# Patient Record
Sex: Male | Born: 1960 | Race: White | Hispanic: No | Smoking: Never smoker
Health system: Southern US, Community
[De-identification: ages and names within clinical notes are randomized; demographics above are authoritative.]

## PROBLEM LIST (undated history)

## (undated) DIAGNOSIS — E119 Type 2 diabetes mellitus without complications: Secondary | ICD-10-CM

---

## 2007-07-01 ENCOUNTER — Emergency Department (HOSPITAL_COMMUNITY): Admission: EM | Admit: 2007-07-01 | Discharge: 2007-07-01 | Payer: Self-pay | Admitting: Emergency Medicine

## 2014-05-16 ENCOUNTER — Emergency Department (HOSPITAL_COMMUNITY): Payer: BC Managed Care – PPO

## 2014-05-16 ENCOUNTER — Encounter (HOSPITAL_COMMUNITY): Payer: Self-pay | Admitting: Emergency Medicine

## 2014-05-16 ENCOUNTER — Emergency Department (HOSPITAL_COMMUNITY)
Admission: EM | Admit: 2014-05-16 | Discharge: 2014-05-16 | Disposition: A | Discharge status: Home / Self Care | Payer: BC Managed Care – PPO | Attending: Emergency Medicine | Admitting: Emergency Medicine

## 2014-05-16 DIAGNOSIS — Z7982 Long term (current) use of aspirin: Secondary | ICD-10-CM | POA: Diagnosis not present

## 2014-05-16 DIAGNOSIS — R079 Chest pain, unspecified: Secondary | ICD-10-CM

## 2014-05-16 DIAGNOSIS — Z79899 Other long term (current) drug therapy: Secondary | ICD-10-CM | POA: Insufficient documentation

## 2014-05-16 DIAGNOSIS — R0789 Other chest pain: Secondary | ICD-10-CM

## 2014-05-16 DIAGNOSIS — I159 Secondary hypertension, unspecified: Secondary | ICD-10-CM

## 2014-05-16 LAB — CBC WITH DIFFERENTIAL/PLATELET
Basophils Absolute: 0.1 10*3/uL (ref 0.0–0.1)
Basophils Relative: 1 % (ref 0–1)
Eosinophils Absolute: 0 10*3/uL (ref 0.0–0.7)
Eosinophils Relative: 1 % (ref 0–5)
HCT: 45.2 % (ref 39.0–52.0)
Hemoglobin: 16.3 g/dL (ref 13.0–17.0)
Lymphocytes Relative: 30 % (ref 12–46)
Lymphs Abs: 1.4 10*3/uL (ref 0.7–4.0)
MCH: 31.9 pg (ref 26.0–34.0)
MCHC: 36.1 g/dL — ABNORMAL HIGH (ref 30.0–36.0)
MCV: 88.5 fL (ref 78.0–100.0)
Monocytes Absolute: 0.4 10*3/uL (ref 0.1–1.0)
Monocytes Relative: 8 % (ref 3–12)
Neutro Abs: 2.9 10*3/uL (ref 1.7–7.7)
Neutrophils Relative %: 60 % (ref 43–77)
Platelets: 184 10*3/uL (ref 150–400)
RBC: 5.11 MIL/uL (ref 4.22–5.81)
RDW: 12.4 % (ref 11.5–15.5)
WBC: 4.8 10*3/uL (ref 4.0–10.5)

## 2014-05-16 LAB — COMPREHENSIVE METABOLIC PANEL
ALT: <5 U/L (ref 0–53)
AST: <5 U/L (ref 0–37)
Albumin: 4.1 g/dL (ref 3.5–5.2)
Alkaline Phosphatase: 71 U/L (ref 39–117)
Anion gap: 12 (ref 5–15)
BUN: 14 mg/dL (ref 6–23)
CO2: 27 mEq/L (ref 19–32)
Calcium: 9.9 mg/dL (ref 8.4–10.5)
Chloride: 99 mEq/L (ref 96–112)
Creatinine, Ser: 0.83 mg/dL (ref 0.50–1.35)
GFR calc Af Amer: >90 mL/min (ref >90)
GFR calc non Af Amer: >90 mL/min (ref >90)
Glucose, Bld: 154 mg/dL — ABNORMAL HIGH (ref 70–99)
Potassium: 4.5 mEq/L (ref 3.7–5.3)
Sodium: 138 mEq/L (ref 137–147)
Total Bilirubin: 0.5 mg/dL (ref 0.3–1.2)
Total Protein: 8.3 g/dL (ref 6.0–8.3)

## 2014-05-16 LAB — TROPONIN I: Troponin I: <0.3 ng/mL (ref <0.30)

## 2014-05-16 LAB — I-STAT TROPONIN, ED: Troponin i, poc: 0 ng/mL (ref 0.00–0.08)

## 2014-05-16 MED ORDER — METOPROLOL TARTRATE 50 MG PO TABS
50.0000 mg | ORAL_TABLET | Freq: Once | ORAL | Status: AC
  Administered 2014-05-16: 50 mg via ORAL
  Filled 2014-05-16: qty 1

## 2014-05-16 MED ORDER — HYDROCHLOROTHIAZIDE 25 MG PO TABS: 25.0000 mg | ORAL_TABLET | Freq: Every day | ORAL | Status: AC

## 2014-05-16 MED ORDER — ASPIRIN 81 MG PO CHEW
324.0000 mg | CHEWABLE_TABLET | Freq: Once | ORAL | Status: AC
  Administered 2014-05-16: 324 mg via ORAL
  Filled 2014-05-16: qty 4

## 2014-05-16 MED ORDER — SODIUM CHLORIDE 0.9 % IV SOLN
1000.0000 mL | INTRAVENOUS | Status: DC
  Administered 2014-05-16: 1000 mL via INTRAVENOUS

## 2014-05-16 NOTE — ED Provider Notes (Signed)
CSN: [id number]     Arrival date & time 05/16/14  1100 History   This chart was scribed for Alexander Warner Alexander Schreier, MD by Murriel HopperAlec Warner, ED Scribe. This patient was seen in room APA19/APA19 and the patient's care was started at 11:21 AM.   Chief Complaint  Patient presents with  . Chest Pain    HPI HPI Comments: Alexander HammingMark Warner is a 53 y.o. male who presents to the Emergency Department complaining of intermittent chest palpitations that have been present since this morning. Pt reports that his heart is "thumping like crazy" and also reports associated lightheadedness whenever he bends down and stands back up. Pt also reports being somewhat SOB. He has no Hx of heart problems and no FHx of heart problems. Pt takes aspirin. Pt is not a smoker and drinks alcohol regularly. Pt reports drinking alcohol last night. Pt denies vomiting   History reviewed. No pertinent past medical history. History reviewed. No pertinent past surgical history. History reviewed. No pertinent family history. History  Substance Use Topics  . Smoking status: Never Smoker   . Smokeless tobacco: Not on file  . Alcohol Use: Yes     Comment: daily use    Review of Systems  A complete 10 system review of systems was obtained and all systems are negative except as noted in the HPI and PMH.    Allergies  Review of patient's allergies indicates no known allergies.  Home Medications   Prior to Admission medications   Medication Sig Start Date End Date Taking? Authorizing Provider  aspirin EC 81 MG tablet Take 81 mg by mouth daily.   Yes Historical Provider, MD  hydrochlorothiazide (HYDRODIURIL) 25 MG tablet Take 1 tablet (25 mg total) by mouth daily. 05/16/14   Alexander Warner Adisson Deak, MD   BP 167/109  Pulse 85  Temp(Src) 98.4 F (36.9 C) (Oral)  Resp 14  Ht 5\' 10"  (1.778 m)  Wt 195 lb (88.451 kg)  BMI 27.98 kg/m2  SpO2 99% Physical Exam  Nursing note and vitals reviewed. Constitutional: He appears well-developed and well-nourished. No  distress.  HENT:  Head: Normocephalic and atraumatic.  Right Ear: External ear normal.  Left Ear: External ear normal.  Eyes: Conjunctivae are normal. Right eye exhibits no discharge. Left eye exhibits no discharge. No scleral icterus.  Neck: Neck supple. No tracheal deviation present.  Cardiovascular: Normal rate, regular rhythm and intact distal pulses.   Pulmonary/Chest: Effort normal and breath sounds normal. No stridor. No respiratory distress. He has no wheezes. He has no rales.  Abdominal: Soft. Bowel sounds are normal. He exhibits no distension. There is no tenderness. There is no rebound and no guarding.  Musculoskeletal: He exhibits no edema and no tenderness.  Neurological: He is alert. He has normal strength. No cranial nerve deficit (no facial droop, extraocular movements intact, no slurred speech) or sensory deficit. He exhibits normal muscle tone. He displays no seizure activity. Coordination normal.  Skin: Skin is warm and dry. No rash noted.  Psychiatric: He has a normal mood and affect.    ED Course  Procedures (including critical care time)  DIAGNOSTIC STUDIES: Oxygen Saturation is 99% on RA, normal by my interpretation.    COORDINATION OF CARE: 11:26 AM Discussed treatment plan with pt at bedside and pt agreed to plan.   Labs Review Labs Reviewed  CBC WITH DIFFERENTIAL - Abnormal; Notable for the following:    MCHC 36.1 (*)    All other components within normal limits  COMPREHENSIVE METABOLIC PANEL -  Abnormal; Notable for the following:    Glucose, Bld 154 (*)    All other components within normal limits  TROPONIN I  Rosezena SensorI-STAT TROPOININ, ED    Imaging Review Dg Chest Port 1 View  05/16/2014   CLINICAL DATA:  Chest pain, palpitations  EXAM: PORTABLE CHEST - 1 VIEW  COMPARISON:  None.  FINDINGS: The heart size and mediastinal contours are within normal limits. Both lungs are clear. The visualized skeletal structures are unremarkable.  IMPRESSION: No active  disease.   Electronically Signed   By: Ruel Favorsrevor  Shick M.D.   On: 05/16/2014 12:08     EKG Interpretation   Date/Time:  Sunday May 16 2014 11:12:17 EDT Ventricular Rate:  98 PR Interval:  142 QRS Duration: 92 QT Interval:  331 QTC Calculation: 423 R Axis:   78 Text Interpretation:  Sinus rhythm Probable left atrial enlargement RSR'  in V1 or V2, probably normal variant No old tracing to compare Confirmed  by Murrell Elizondo  MD-J, Sylvie Mifsud ([id number]) on 05/16/2014 11:58:41 AM      MDM   Final diagnoses:  Chest pain, unspecified chest pain type  Secondary hypertension, unspecified    Pt low risk for ACS.   Heart score of 2.  Symptoms described as a thumping, not typical description of angina.  Pt is hypertensive.  New diagnosis for him.  Will dc home on HCTZ.  Follow up with a pcp.  Warning signs and precautions discussed.  I personally performed the services described in this documentation, which was scribed in my presence.  The recorded information has been reviewed and is accurate.   Alexander Warner [name], MD 05/16/14 [phone]

## 2014-05-16 NOTE — Discharge Instructions (Signed)

## 2014-05-16 NOTE — ED Notes (Signed)
Pt was at church this morning and states not feeling right, c/o CP pressure in nature, mild SOB per pt

## 2016-01-12 DIAGNOSIS — Z1159 Encounter for screening for other viral diseases: Secondary | ICD-10-CM | POA: Diagnosis not present

## 2016-01-12 DIAGNOSIS — Z0000 Encounter for general adult medical examination without abnormal findings: Secondary | ICD-10-CM | POA: Diagnosis not present

## 2016-01-12 DIAGNOSIS — Z23 Encounter for immunization: Secondary | ICD-10-CM | POA: Diagnosis not present

## 2016-01-12 DIAGNOSIS — Z125 Encounter for screening for malignant neoplasm of prostate: Secondary | ICD-10-CM | POA: Diagnosis not present

## 2017-01-16 DIAGNOSIS — E669 Obesity, unspecified: Secondary | ICD-10-CM | POA: Diagnosis not present

## 2017-01-16 DIAGNOSIS — R739 Hyperglycemia, unspecified: Secondary | ICD-10-CM | POA: Diagnosis not present

## 2017-01-16 DIAGNOSIS — E7800 Pure hypercholesterolemia, unspecified: Secondary | ICD-10-CM | POA: Diagnosis not present

## 2017-01-16 DIAGNOSIS — I10 Essential (primary) hypertension: Secondary | ICD-10-CM | POA: Diagnosis not present

## 2017-01-16 DIAGNOSIS — Z0000 Encounter for general adult medical examination without abnormal findings: Secondary | ICD-10-CM | POA: Diagnosis not present

## 2018-01-17 DIAGNOSIS — E1169 Type 2 diabetes mellitus with other specified complication: Secondary | ICD-10-CM | POA: Diagnosis not present

## 2018-01-17 DIAGNOSIS — I10 Essential (primary) hypertension: Secondary | ICD-10-CM | POA: Diagnosis not present

## 2018-01-17 DIAGNOSIS — Z0000 Encounter for general adult medical examination without abnormal findings: Secondary | ICD-10-CM | POA: Diagnosis not present

## 2018-01-17 DIAGNOSIS — E781 Pure hyperglyceridemia: Secondary | ICD-10-CM | POA: Diagnosis not present

## 2018-01-17 DIAGNOSIS — E669 Obesity, unspecified: Secondary | ICD-10-CM | POA: Diagnosis not present

## 2018-01-17 DIAGNOSIS — E7800 Pure hypercholesterolemia, unspecified: Secondary | ICD-10-CM | POA: Diagnosis not present

## 2018-01-17 DIAGNOSIS — Z1211 Encounter for screening for malignant neoplasm of colon: Secondary | ICD-10-CM | POA: Diagnosis not present

## 2018-01-17 DIAGNOSIS — Z125 Encounter for screening for malignant neoplasm of prostate: Secondary | ICD-10-CM | POA: Diagnosis not present

## 2018-01-17 DIAGNOSIS — E782 Mixed hyperlipidemia: Secondary | ICD-10-CM | POA: Diagnosis not present

## 2018-04-21 ENCOUNTER — Encounter: Payer: Self-pay | Admitting: Nutrition

## 2018-04-21 ENCOUNTER — Encounter: Payer: BLUE CROSS/BLUE SHIELD | Attending: Family Medicine | Admitting: Nutrition

## 2018-04-21 VITALS — Ht 70.0 in | Wt 201.0 lb

## 2018-04-21 DIAGNOSIS — E7800 Pure hypercholesterolemia, unspecified: Secondary | ICD-10-CM | POA: Diagnosis not present

## 2018-04-21 DIAGNOSIS — E119 Type 2 diabetes mellitus without complications: Secondary | ICD-10-CM | POA: Insufficient documentation

## 2018-04-21 DIAGNOSIS — Z713 Dietary counseling and surveillance: Secondary | ICD-10-CM | POA: Diagnosis not present

## 2018-04-21 DIAGNOSIS — I10 Essential (primary) hypertension: Secondary | ICD-10-CM

## 2018-04-21 DIAGNOSIS — E118 Type 2 diabetes mellitus with unspecified complications: Secondary | ICD-10-CM

## 2018-04-21 DIAGNOSIS — E782 Mixed hyperlipidemia: Secondary | ICD-10-CM

## 2018-04-21 NOTE — Patient Instructions (Addendum)
Goals 1. Follow My Plate 2. Exercise 60 minutes once 2 week. 3. Increase water intake 84 oz. 4. Lose 1 lb per week. Testing blood once a day Goals FBS 80-130 mg/dl and Bedtime less than 161 mg/dl.

## 2018-04-21 NOTE — Progress Notes (Signed)
  Medical Nutrition Therapy:  Appt start time: 0800 end time:  0930.   Assessment:  Primary concerns today: Diabetes Typ 2, HTN, Hyperlipidemia. Lives by himself. Doesn't cook a lot at home. Tends to eat a lot of fast foods.Has been working on trying to get back into biking for exercise. Eats 3 meals per day and 1-2 snacks. Drinks sodas and not a lot of water. Doesn't cook much at home. Willing to make changes with diet and exercise. Wants to lose about 20 lbs.  Engaged to make changes. TCHOL 256 mg/dl, TG 4-1, NON HDL 161, CHOL HD Ratio 5.6 LDL 160 A1C 7.8%, up from 0.9% last year. Just started on Atorvastin, baby aspirin and Metformin 500 mg BID. And HCTZ. Doesn't have a meter and hasn't been testing blood sugars.    Preferred Learning Style:  No preference indicated   Learning Readiness:   Not ready  Ready  Change in progress   MEDICATIONS:   DIETARY INTAKE:  24-hr recall:  B ( AM): Apple or granola bar or ice coffee Snk ( AM): occasionally fruit  L ( PM): salads or fast food drive through Snk ( PM):  D ( PM): Tacos 3- beef, wine 4-6 oz, Snk ( PM):  Beverages: water, 1 soda per day.   Usual physical activity: rides bikes,  Estimated energy needs: 1800  calories 200  g carbohydrates 135 g protein 50 g fat  Progress Towards Goal(s):  In progress.   Nutritional Diagnosis:  NB-1.1 Food and nutrition-related knowledge deficit As related to DIabetes.  As evidenced by A1C 7,8.    Intervention:  Nutrition and Diabetes education provided on My Plate, CHO counting, meal planning, portion sizes, timing of meals, avoiding snacks between meals unless having a low blood sugar, target ranges for A1C and blood sugars, signs/symptoms and treatment of hyper/hypoglycemia, monitoring blood sugars, taking medications as prescribed, benefits of exercising 30 minutes per day and prevention of complications of DM. Marland Kitchen Goals 1. Follow My Plate 2. Exercise 60 minutes once 2 week. 3.  Increase water intake 84 oz. 4. Lose 1 lb per week. Testing blood once a day Goals FBS 80-130 mg/dl and Bedtime less than 604 mg/dl.  Teaching Method Utilized:  Visual Auditory Hands on  Handouts given during visit include:  The Plate Method Meal Plan Card   Barriers to learning/adherence to lifestyle change: none  Demonstrated degree of understanding via:  Teach Back   Monitoring/Evaluation:  Dietary intake, exercise, meal planning, and body weight in 1 month(s).

## 2018-04-23 ENCOUNTER — Encounter: Payer: Self-pay | Admitting: Nutrition

## 2018-05-05 DIAGNOSIS — E663 Overweight: Secondary | ICD-10-CM | POA: Diagnosis not present

## 2018-05-05 DIAGNOSIS — E1169 Type 2 diabetes mellitus with other specified complication: Secondary | ICD-10-CM | POA: Diagnosis not present

## 2018-05-05 DIAGNOSIS — I10 Essential (primary) hypertension: Secondary | ICD-10-CM | POA: Diagnosis not present

## 2018-05-05 DIAGNOSIS — E7800 Pure hypercholesterolemia, unspecified: Secondary | ICD-10-CM | POA: Diagnosis not present

## 2018-05-29 ENCOUNTER — Encounter: Payer: BLUE CROSS/BLUE SHIELD | Attending: Family Medicine | Admitting: Nutrition

## 2018-05-29 ENCOUNTER — Encounter: Payer: Self-pay | Admitting: Nutrition

## 2018-05-29 VITALS — Ht 70.0 in | Wt 200.0 lb

## 2018-05-29 DIAGNOSIS — E119 Type 2 diabetes mellitus without complications: Secondary | ICD-10-CM | POA: Diagnosis not present

## 2018-05-29 DIAGNOSIS — IMO0002 Reserved for concepts with insufficient information to code with codable children: Secondary | ICD-10-CM

## 2018-05-29 DIAGNOSIS — Z6828 Body mass index (BMI) 28.0-28.9, adult: Secondary | ICD-10-CM | POA: Insufficient documentation

## 2018-05-29 DIAGNOSIS — Z713 Dietary counseling and surveillance: Secondary | ICD-10-CM | POA: Insufficient documentation

## 2018-05-29 DIAGNOSIS — E118 Type 2 diabetes mellitus with unspecified complications: Secondary | ICD-10-CM

## 2018-05-29 DIAGNOSIS — E1165 Type 2 diabetes mellitus with hyperglycemia: Secondary | ICD-10-CM

## 2018-05-29 NOTE — Progress Notes (Signed)
  Medical Nutrition Therapy:  Appt start time: 0900 end time:  0930.   Assessment:  Primary concerns today: Diabetes Typ 2, HTN, Hyperlipidemia. Lives by himself.   Has been working out some by riding his bike.Marland Kitchen Has increased water intake some..  Cut out sodas. Cut out eating fast foods. Still working on increasing exercise and water intake. Has been testing some in am but didn't bring log sheet due to reconstruction at home and has lost paperwork. FBS 130's.  Metformin 500 mg BID. Wants to lose 20 lbs. Lost 1 lb.  Slow progress. Sees PCP in March 2020.and will get lab work done then. Would benefit from more lower carb vegetables.  Preferred Learning Style:  No preference indicated   Learning Readiness:   Not ready  Ready  Change in progress   MEDICATIONS:   DIETARY INTAKE:  24-hr recall:  B ( AM): egg and bran toast. Snk ( AM):  L ( PM):sandwich with whole wheat bread, meat/chese, chips , water  Snk ( PM):  D ( PM):  Beans, rice, potatoes, beer, water Snk ( PM):  Beverages: water,   Usual physical activity: rides bikes,  Estimated energy needs: 1800  calories 200  g carbohydrates 135 g protein 50 g fat  Progress Towards Goal(s):  In progress.   Nutritional Diagnosis:  NB-1.1 Food and nutrition-related knowledge deficit As related to DIabetes.  As evidenced by A1C 7,8.    Intervention:  Nutrition and Diabetes education provided on My Plate, CHO counting, meal planning, portion sizes, timing of meals, avoiding snacks between meals unless having a low blood sugar, target ranges for A1C and blood sugars, signs/symptoms and treatment of hyper/hypoglycemia, monitoring blood sugars, taking medications as prescribed, benefits of exercising 30 minutes per day and prevention of complications of DM. Marland Kitchen Goals 1. Increase water intake 50 oz a day.  2. Increase exercise to 1-2 week 3. Test blood sugars in am 2-3 times per week 4. Lose 2-3 lbs per week Goals FBS 80-130 mg/dl  and Bedtime less than 150 mg/dl.  Teaching Method Utilized:  Visual Auditory Hands on  Handouts given during visit include:  The Plate Method Meal Plan Card   Barriers to learning/adherence to lifestyle change: none  Demonstrated degree of understanding via:  Teach Back   Monitoring/Evaluation:  Dietary intake, exercise, meal planning, and body weight in 6 month(s).

## 2018-05-29 NOTE — Patient Instructions (Signed)
Goals 1. Increase water intake 50 oz a day.  2. Increase exercise to 1-2 week 3. Test blood sugars in am 2-3 times per week 4. Lose 2-3 lbs per week

## 2018-09-08 DIAGNOSIS — E663 Overweight: Secondary | ICD-10-CM | POA: Diagnosis not present

## 2018-09-08 DIAGNOSIS — E1169 Type 2 diabetes mellitus with other specified complication: Secondary | ICD-10-CM | POA: Diagnosis not present

## 2018-09-08 DIAGNOSIS — I10 Essential (primary) hypertension: Secondary | ICD-10-CM | POA: Diagnosis not present

## 2018-09-08 DIAGNOSIS — E7800 Pure hypercholesterolemia, unspecified: Secondary | ICD-10-CM | POA: Diagnosis not present

## 2018-11-26 ENCOUNTER — Ambulatory Visit: Payer: BLUE CROSS/BLUE SHIELD | Admitting: Nutrition

## 2019-01-08 ENCOUNTER — Ambulatory Visit: Payer: BLUE CROSS/BLUE SHIELD | Admitting: Nutrition

## 2019-02-09 DIAGNOSIS — I10 Essential (primary) hypertension: Secondary | ICD-10-CM | POA: Diagnosis not present

## 2019-02-09 DIAGNOSIS — E1169 Type 2 diabetes mellitus with other specified complication: Secondary | ICD-10-CM | POA: Diagnosis not present

## 2019-02-09 DIAGNOSIS — E7800 Pure hypercholesterolemia, unspecified: Secondary | ICD-10-CM | POA: Diagnosis not present

## 2019-02-19 DIAGNOSIS — I10 Essential (primary) hypertension: Secondary | ICD-10-CM | POA: Diagnosis not present

## 2019-02-19 DIAGNOSIS — E1169 Type 2 diabetes mellitus with other specified complication: Secondary | ICD-10-CM | POA: Diagnosis not present

## 2019-02-19 DIAGNOSIS — E7800 Pure hypercholesterolemia, unspecified: Secondary | ICD-10-CM | POA: Diagnosis not present

## 2019-06-22 DIAGNOSIS — E1169 Type 2 diabetes mellitus with other specified complication: Secondary | ICD-10-CM | POA: Diagnosis not present

## 2019-08-26 DIAGNOSIS — Z5181 Encounter for therapeutic drug level monitoring: Secondary | ICD-10-CM | POA: Diagnosis not present

## 2019-08-26 DIAGNOSIS — E7800 Pure hypercholesterolemia, unspecified: Secondary | ICD-10-CM | POA: Diagnosis not present

## 2019-08-26 DIAGNOSIS — Z0000 Encounter for general adult medical examination without abnormal findings: Secondary | ICD-10-CM | POA: Diagnosis not present

## 2019-08-26 DIAGNOSIS — I10 Essential (primary) hypertension: Secondary | ICD-10-CM | POA: Diagnosis not present

## 2019-08-26 DIAGNOSIS — E1169 Type 2 diabetes mellitus with other specified complication: Secondary | ICD-10-CM | POA: Diagnosis not present

## 2019-12-29 DIAGNOSIS — E1169 Type 2 diabetes mellitus with other specified complication: Secondary | ICD-10-CM | POA: Diagnosis not present

## 2020-02-26 DIAGNOSIS — I10 Essential (primary) hypertension: Secondary | ICD-10-CM | POA: Diagnosis not present

## 2020-02-26 DIAGNOSIS — E1169 Type 2 diabetes mellitus with other specified complication: Secondary | ICD-10-CM | POA: Diagnosis not present

## 2020-02-26 DIAGNOSIS — Z125 Encounter for screening for malignant neoplasm of prostate: Secondary | ICD-10-CM | POA: Diagnosis not present

## 2020-02-26 DIAGNOSIS — E7800 Pure hypercholesterolemia, unspecified: Secondary | ICD-10-CM | POA: Diagnosis not present

## 2020-02-26 DIAGNOSIS — E663 Overweight: Secondary | ICD-10-CM | POA: Diagnosis not present

## 2020-05-30 DIAGNOSIS — I10 Essential (primary) hypertension: Secondary | ICD-10-CM | POA: Diagnosis not present

## 2020-05-30 DIAGNOSIS — E1169 Type 2 diabetes mellitus with other specified complication: Secondary | ICD-10-CM | POA: Diagnosis not present

## 2020-05-30 DIAGNOSIS — E7800 Pure hypercholesterolemia, unspecified: Secondary | ICD-10-CM | POA: Diagnosis not present

## 2020-08-09 DIAGNOSIS — C44519 Basal cell carcinoma of skin of other part of trunk: Secondary | ICD-10-CM | POA: Diagnosis not present

## 2020-08-09 DIAGNOSIS — D485 Neoplasm of uncertain behavior of skin: Secondary | ICD-10-CM | POA: Diagnosis not present

## 2020-08-09 DIAGNOSIS — D2362 Other benign neoplasm of skin of left upper limb, including shoulder: Secondary | ICD-10-CM | POA: Diagnosis not present

## 2020-08-09 DIAGNOSIS — L720 Epidermal cyst: Secondary | ICD-10-CM | POA: Diagnosis not present

## 2020-08-09 DIAGNOSIS — C44619 Basal cell carcinoma of skin of left upper limb, including shoulder: Secondary | ICD-10-CM | POA: Diagnosis not present

## 2020-08-09 DIAGNOSIS — L570 Actinic keratosis: Secondary | ICD-10-CM | POA: Diagnosis not present

## 2020-08-09 DIAGNOSIS — D225 Melanocytic nevi of trunk: Secondary | ICD-10-CM | POA: Diagnosis not present

## 2020-08-29 DIAGNOSIS — E1165 Type 2 diabetes mellitus with hyperglycemia: Secondary | ICD-10-CM | POA: Diagnosis not present

## 2020-11-08 DIAGNOSIS — D2372 Other benign neoplasm of skin of left lower limb, including hip: Secondary | ICD-10-CM | POA: Diagnosis not present

## 2020-11-08 DIAGNOSIS — C44712 Basal cell carcinoma of skin of right lower limb, including hip: Secondary | ICD-10-CM | POA: Diagnosis not present

## 2020-11-08 DIAGNOSIS — Z85828 Personal history of other malignant neoplasm of skin: Secondary | ICD-10-CM | POA: Diagnosis not present

## 2020-11-08 DIAGNOSIS — D225 Melanocytic nevi of trunk: Secondary | ICD-10-CM | POA: Diagnosis not present

## 2020-12-13 ENCOUNTER — Observation Stay (HOSPITAL_COMMUNITY)
Admission: EM | Admit: 2020-12-13 | Discharge: 2020-12-15 | Disposition: A | Discharge status: Home / Self Care | Payer: BC Managed Care – PPO | Attending: Family Medicine | Admitting: Family Medicine

## 2020-12-13 ENCOUNTER — Emergency Department (HOSPITAL_COMMUNITY): Payer: BC Managed Care – PPO

## 2020-12-13 ENCOUNTER — Encounter (HOSPITAL_COMMUNITY): Payer: Self-pay | Admitting: *Deleted

## 2020-12-13 ENCOUNTER — Other Ambulatory Visit: Payer: Self-pay

## 2020-12-13 DIAGNOSIS — R531 Weakness: Secondary | ICD-10-CM | POA: Diagnosis not present

## 2020-12-13 DIAGNOSIS — Z7984 Long term (current) use of oral hypoglycemic drugs: Secondary | ICD-10-CM | POA: Diagnosis not present

## 2020-12-13 DIAGNOSIS — E119 Type 2 diabetes mellitus without complications: Secondary | ICD-10-CM | POA: Diagnosis not present

## 2020-12-13 DIAGNOSIS — I639 Cerebral infarction, unspecified: Secondary | ICD-10-CM | POA: Diagnosis not present

## 2020-12-13 DIAGNOSIS — R29810 Facial weakness: Secondary | ICD-10-CM | POA: Diagnosis not present

## 2020-12-13 DIAGNOSIS — Y900 Blood alcohol level of less than 20 mg/100 ml: Secondary | ICD-10-CM | POA: Insufficient documentation

## 2020-12-13 DIAGNOSIS — E785 Hyperlipidemia, unspecified: Secondary | ICD-10-CM | POA: Diagnosis not present

## 2020-12-13 DIAGNOSIS — Z20822 Contact with and (suspected) exposure to covid-19: Secondary | ICD-10-CM | POA: Insufficient documentation

## 2020-12-13 DIAGNOSIS — Z79899 Other long term (current) drug therapy: Secondary | ICD-10-CM | POA: Insufficient documentation

## 2020-12-13 DIAGNOSIS — Z7982 Long term (current) use of aspirin: Secondary | ICD-10-CM | POA: Insufficient documentation

## 2020-12-13 DIAGNOSIS — R9431 Abnormal electrocardiogram [ECG] [EKG]: Secondary | ICD-10-CM | POA: Diagnosis not present

## 2020-12-13 DIAGNOSIS — I10 Essential (primary) hypertension: Secondary | ICD-10-CM | POA: Diagnosis not present

## 2020-12-13 DIAGNOSIS — R29898 Other symptoms and signs involving the musculoskeletal system: Secondary | ICD-10-CM | POA: Diagnosis not present

## 2020-12-13 HISTORY — DX: Type 2 diabetes mellitus without complications: E11.9

## 2020-12-13 LAB — PROTIME-INR
INR: 0.9 (ref 0.8–1.2)
Prothrombin Time: 12.3 seconds (ref 11.4–15.2)

## 2020-12-13 LAB — I-STAT CHEM 8, ED
BUN: 18 mg/dL (ref 6–20)
Calcium, Ion: 1.16 mmol/L (ref 1.15–1.40)
Chloride: 100 mmol/L (ref 98–111)
Creatinine, Ser: 0.7 mg/dL (ref 0.61–1.24)
Glucose, Bld: 233 mg/dL — ABNORMAL HIGH (ref 70–99)
HCT: 46 % (ref 39.0–52.0)
Hemoglobin: 15.6 g/dL (ref 13.0–17.0)
Potassium: 6.8 mmol/L (ref 3.5–5.1)
Sodium: 134 mmol/L — ABNORMAL LOW (ref 135–145)
TCO2: 28 mmol/L (ref 22–32)

## 2020-12-13 LAB — COMPREHENSIVE METABOLIC PANEL
ALT: 26 U/L (ref 0–44)
AST: 23 U/L (ref 15–41)
Albumin: 4.6 g/dL (ref 3.5–5.0)
Alkaline Phosphatase: 49 U/L (ref 38–126)
Anion gap: 7 (ref 5–15)
BUN: 14 mg/dL (ref 6–20)
CO2: 28 mmol/L (ref 22–32)
Calcium: 9.7 mg/dL (ref 8.9–10.3)
Chloride: 100 mmol/L (ref 98–111)
Creatinine, Ser: 0.85 mg/dL (ref 0.61–1.24)
GFR, Estimated: >60 mL/min (ref >60)
Glucose, Bld: 227 mg/dL — ABNORMAL HIGH (ref 70–99)
Potassium: 4.2 mmol/L (ref 3.5–5.1)
Sodium: 135 mmol/L (ref 135–145)
Total Bilirubin: 0.7 mg/dL (ref 0.3–1.2)
Total Protein: 7.4 g/dL (ref 6.5–8.1)

## 2020-12-13 LAB — RESP PANEL BY RT-PCR (FLU A&B, COVID) ARPGX2
Influenza A by PCR: NEGATIVE
Influenza B by PCR: NEGATIVE
SARS Coronavirus 2 by RT PCR: NEGATIVE

## 2020-12-13 LAB — CBC
HCT: 43.9 % (ref 39.0–52.0)
Hemoglobin: 15.3 g/dL (ref 13.0–17.0)
MCH: 31.8 pg (ref 26.0–34.0)
MCHC: 34.9 g/dL (ref 30.0–36.0)
MCV: 91.3 fL (ref 80.0–100.0)
Platelets: 155 10*3/uL (ref 150–400)
RBC: 4.81 MIL/uL (ref 4.22–5.81)
RDW: 12.8 % (ref 11.5–15.5)
WBC: 6 10*3/uL (ref 4.0–10.5)
nRBC: 0 % (ref 0.0–0.2)

## 2020-12-13 LAB — DIFFERENTIAL
Abs Immature Granulocytes: 0.01 10*3/uL (ref 0.00–0.07)
Basophils Absolute: 0.1 10*3/uL (ref 0.0–0.1)
Basophils Relative: 1 %
Eosinophils Absolute: 0.1 10*3/uL (ref 0.0–0.5)
Eosinophils Relative: 2 %
Immature Granulocytes: 0 %
Lymphocytes Relative: 28 %
Lymphs Abs: 1.7 10*3/uL (ref 0.7–4.0)
Monocytes Absolute: 0.5 10*3/uL (ref 0.1–1.0)
Monocytes Relative: 9 %
Neutro Abs: 3.6 10*3/uL (ref 1.7–7.7)
Neutrophils Relative %: 60 %

## 2020-12-13 LAB — GLUCOSE, CAPILLARY: Glucose-Capillary: 142 mg/dL — ABNORMAL HIGH (ref 70–99)

## 2020-12-13 LAB — APTT: aPTT: 26 seconds (ref 24–36)

## 2020-12-13 LAB — ETHANOL: Alcohol, Ethyl (B): <10 mg/dL (ref <10)

## 2020-12-13 LAB — CBG MONITORING, ED: Glucose-Capillary: 239 mg/dL — ABNORMAL HIGH (ref 70–99)

## 2020-12-13 MED ORDER — HEPARIN SODIUM (PORCINE) 5000 UNIT/ML IJ SOLN
5000.0000 [IU] | Freq: Three times a day (TID) | INTRAMUSCULAR | Status: DC
  Administered 2020-12-14 – 2020-12-15 (×3): 5000 [IU] via SUBCUTANEOUS
  Filled 2020-12-13 (×3): qty 1

## 2020-12-13 MED ORDER — ASPIRIN EC 81 MG PO TBEC
81.0000 mg | DELAYED_RELEASE_TABLET | Freq: Every day | ORAL | Status: DC
  Administered 2020-12-14 – 2020-12-15 (×2): 81 mg via ORAL
  Filled 2020-12-13 (×2): qty 1

## 2020-12-13 MED ORDER — ACETAMINOPHEN 650 MG RE SUPP: 650.0000 mg | RECTAL | Status: DC | PRN

## 2020-12-13 MED ORDER — ONDANSETRON HCL 4 MG/2ML IJ SOLN: 4.0000 mg | Freq: Four times a day (QID) | INTRAMUSCULAR | Status: DC | PRN

## 2020-12-13 MED ORDER — IOHEXOL 350 MG/ML SOLN
75.0000 mL | Freq: Once | INTRAVENOUS | Status: AC | PRN
  Administered 2020-12-13: 75 mL via INTRAVENOUS

## 2020-12-13 MED ORDER — STROKE: EARLY STAGES OF RECOVERY BOOK: Freq: Once | Status: DC

## 2020-12-13 MED ORDER — INSULIN ASPART 100 UNIT/ML IJ SOLN: 0.0000 [IU] | Freq: Every day | INTRAMUSCULAR | Status: DC

## 2020-12-13 MED ORDER — ATORVASTATIN CALCIUM 10 MG PO TABS
10.0000 mg | ORAL_TABLET | Freq: Every day | ORAL | Status: DC
  Administered 2020-12-14 – 2020-12-15 (×2): 10 mg via ORAL
  Filled 2020-12-13 (×2): qty 1

## 2020-12-13 MED ORDER — SENNOSIDES-DOCUSATE SODIUM 8.6-50 MG PO TABS: 1.0000 | ORAL_TABLET | Freq: Every evening | ORAL | Status: DC | PRN

## 2020-12-13 MED ORDER — ACETAMINOPHEN 325 MG PO TABS: 650.0000 mg | ORAL_TABLET | ORAL | Status: DC | PRN

## 2020-12-13 MED ORDER — ACETAMINOPHEN 160 MG/5ML PO SOLN: 650.0000 mg | ORAL | Status: DC | PRN

## 2020-12-13 MED ORDER — CLOPIDOGREL BISULFATE 75 MG PO TABS
75.0000 mg | ORAL_TABLET | Freq: Every day | ORAL | Status: DC
  Administered 2020-12-13 – 2020-12-15 (×3): 75 mg via ORAL
  Filled 2020-12-13 (×3): qty 1

## 2020-12-13 MED ORDER — INSULIN ASPART 100 UNIT/ML IJ SOLN
0.0000 [IU] | Freq: Three times a day (TID) | INTRAMUSCULAR | Status: DC
  Administered 2020-12-14: 0 [IU] via SUBCUTANEOUS
  Administered 2020-12-14 – 2020-12-15 (×2): 2 [IU] via SUBCUTANEOUS
  Administered 2020-12-15: 3 [IU] via SUBCUTANEOUS

## 2020-12-13 NOTE — ED Provider Notes (Signed)
[facility] EMERGENCY DEPARTMENT Provider Note   CSN: [id number] Arrival date & time: 12/13/20  1857  An emergency department physician performed an initial assessment on this suspected stroke patient at 54.  History Chief Complaint  Patient presents with  . Extremity Weakness    Alexander Warner is a 60 y.o. male.  HPI   This patient is a 60 year old male, he takes a few different medications including metformin losartan hydrochlorothiazide, baby aspirin and a statin.  He does not smoke cigarettes.  He reports that sometime between noon and 1:00 in the afternoon today, just over 6 hours prior to arrival he developed a feeling of numbness in his left hand that made it difficult for him to type.  He then noticed when he tried to walk that his left leg felt "noodly".  He reports that this has been constant throughout the day, it has not gotten better or worse, he has had no difficulty with speaking or with his vision, he denies any chest pain shortness of breath or headaches.  The symptoms are acute in onset, persistent, they are not getting any worse at this time.  He comes to the hospital at the request of a family member  Past Medical History:  Diagnosis Date  . Diabetes mellitus without complication [facility])     Patient Active Problem List   Diagnosis Date Noted  . Acute ischemic stroke [facility])     History reviewed. No pertinent surgical history.     History reviewed. No pertinent family history.  Social History   Tobacco Use  . Smoking status: Never Smoker  . Smokeless tobacco: Never Used  Substance Use Topics  . Alcohol use: Yes    Comment: daily use  . Drug use: No    Home Medications Prior to Admission medications   Medication Sig Start Date End Date Taking? Authorizing Provider  aspirin EC 81 MG tablet Take 81 mg by mouth daily.    [provider]  atorvastatin (LIPITOR) 10 MG tablet Take 10 mg by mouth daily.    [provider]  hydrochlorothiazide  (HYDRODIURIL) 25 MG tablet Take 1 tablet (25 mg total) by mouth daily. 05/16/14   Linwood Dibbles, MD  losartan (COZAAR) 50 MG tablet Take 50 mg by mouth daily.    [provider]  metFORMIN (GLUCOPHAGE) 500 MG tablet Take by mouth.    [provider]    Allergies    Patient has no known allergies.  Review of Systems   Review of Systems  All other systems reviewed and are negative.   Physical Exam Updated Vital Signs BP (!) 137/96   Pulse (!) 59   Temp (!) 97.5 F (36.4 C) (Oral)   Resp 16   Ht 1.778 m (5\' 10" )   Wt 78 kg   SpO2 96%   BMI 24.68 kg/m   Physical Exam Vitals and nursing note reviewed.  Constitutional:      General: He is not in acute distress.    Appearance: He is well-developed.  HENT:     Head: Normocephalic and atraumatic.     Mouth/Throat:     Pharynx: No oropharyngeal exudate.  Eyes:     General: No scleral icterus.       Right eye: No discharge.        Left eye: No discharge.     Conjunctiva/sclera: Conjunctivae normal.     Pupils: Pupils are equal, round, and reactive to light.  Neck:     Thyroid:  No thyromegaly.     Vascular: No JVD.  Cardiovascular:     Rate and Rhythm: Normal rate and regular rhythm.     Heart sounds: Normal heart sounds. No murmur heard. No friction rub. No gallop.   Pulmonary:     Effort: Pulmonary effort is normal. No respiratory distress.     Breath sounds: Normal breath sounds. No wheezing or rales.  Abdominal:     General: Bowel sounds are normal. There is no distension.     Palpations: Abdomen is soft. There is no mass.     Tenderness: There is no abdominal tenderness.  Musculoskeletal:        General: No tenderness. Normal range of motion.     Cervical back: Normal range of motion and neck supple.  Lymphadenopathy:     Cervical: No cervical adenopathy.  Skin:    General: Skin is warm and dry.     Findings: No erythema or rash.  Neurological:     Mental Status: He is alert.     Coordination:  Coordination normal.     Comments: Slight left-sided weakness of the upper extremity, slight dysmetria of the left upper extremity with finger-nose-finger, slight weakness of the left hip flexor, slight decrease sensation of the left upper extremity left lower extremity in the left side of the face, slight left-sided facial droop.  Normal speech, normal extraocular movements, normal peripheral visual fields, no pronator drift, slight diplopia with central and left-sided gaze.  There is no extinction  Psychiatric:        Behavior: Behavior normal.     ED Results / Procedures / Treatments   Labs (all labs ordered are listed, but only abnormal results are displayed) Labs Reviewed  COMPREHENSIVE METABOLIC PANEL - Abnormal; Notable for the following components:      Result Value   Glucose, Bld 227 (*)    All other components within normal limits  I-STAT CHEM 8, ED - Abnormal; Notable for the following components:   Sodium 134 (*)    Potassium 6.8 (*)    Glucose, Bld 233 (*)    All other components within normal limits  CBG MONITORING, ED - Abnormal; Notable for the following components:   Glucose-Capillary 239 (*)    All other components within normal limits  RESP PANEL BY RT-PCR (FLU A&B, COVID) ARPGX2  ETHANOL  PROTIME-INR  APTT  CBC  DIFFERENTIAL  RAPID URINE DRUG SCREEN, HOSP PERFORMED  URINALYSIS, ROUTINE W REFLEX MICROSCOPIC    EKG EKG Interpretation  Date/Time:  Tuesday Dec 13 2020 19:16:03 EDT Ventricular Rate:  63 PR Interval:  146 QRS Duration: 88 QT Interval:  402 QTC Calculation: 411 R Axis:   60 Text Interpretation: Normal sinus rhythm Normal ECG Confirmed by Eber HongMiller, Lilyann Gravelle ([id number]) on 12/13/2020 7:20:08 PM   Radiology CT Angio Head W or Wo Contrast  Result Date: 12/13/2020 CLINICAL DATA:  Left-sided facial droop. Left arm and leg weakness beginning 1200 hours. EXAM: CT ANGIOGRAPHY HEAD AND NECK TECHNIQUE: Multidetector CT imaging of the head and neck was  performed using the standard protocol during bolus administration of intravenous contrast. Multiplanar CT image reconstructions and MIPs were obtained to evaluate the vascular anatomy. Carotid stenosis measurements (when applicable) are obtained utilizing NASCET criteria, using the distal internal carotid diameter as the denominator. CONTRAST:  75mL OMNIPAQUE IOHEXOL 350 MG/ML SOLN COMPARISON:  Head CT earlier same day FINDINGS: CTA NECK FINDINGS Aortic arch: Branching pattern of the brachiocephalic vessels from the arch is normal. No  origin stenosis. Right carotid system: Common carotid artery widely patent to the bifurcation. Carotid bifurcation shows mild soft plaque but no stenosis or irregularity. Cervical ICA widely patent. Left carotid system: Common carotid artery widely patent to the bifurcation. Soft and calcified plaque at the carotid bifurcation and ICA bulb with some irregularity. No stenosis. Cervical ICA widely patent. Vertebral arteries: Both vertebral artery origins are widely patent. Both vertebral arteries appear normal through the cervical region to the foramen magnum. Skeleton: Mid cervical spondylosis. Other neck: No soft tissue mass or lymphadenopathy. Upper chest: Normal Review of the MIP images confirms the above findings CTA HEAD FINDINGS Anterior circulation: Both internal carotid arteries widely patent through the skull base and siphon regions. The anterior and middle cerebral vessels are patent. No large or medium vessel occlusion, proximal stenosis, aneurysm or vascular malformation. Posterior circulation: Both vertebral arteries widely patent to the basilar. No basilar stenosis. Posterior circulation branch vessels are normal. Venous sinuses: Patent and normal. Anatomic variants: None significant. Review of the MIP images confirms the above findings IMPRESSION: No intracranial large or medium vessel occlusion. Atherosclerotic plaque at both carotid bifurcations but without stenosis.  Mild irregularity on the left. Electronically Signed   By: Paulina Fusi M.D.   On: 12/13/2020 20:32   CT Angio Neck W and/or Wo Contrast  Result Date: 12/13/2020 CLINICAL DATA:  Left-sided facial droop. Left arm and leg weakness beginning 1200 hours. EXAM: CT ANGIOGRAPHY HEAD AND NECK TECHNIQUE: Multidetector CT imaging of the head and neck was performed using the standard protocol during bolus administration of intravenous contrast. Multiplanar CT image reconstructions and MIPs were obtained to evaluate the vascular anatomy. Carotid stenosis measurements (when applicable) are obtained utilizing NASCET criteria, using the distal internal carotid diameter as the denominator. CONTRAST:  57mL OMNIPAQUE IOHEXOL 350 MG/ML SOLN COMPARISON:  Head CT earlier same day FINDINGS: CTA NECK FINDINGS Aortic arch: Branching pattern of the brachiocephalic vessels from the arch is normal. No origin stenosis. Right carotid system: Common carotid artery widely patent to the bifurcation. Carotid bifurcation shows mild soft plaque but no stenosis or irregularity. Cervical ICA widely patent. Left carotid system: Common carotid artery widely patent to the bifurcation. Soft and calcified plaque at the carotid bifurcation and ICA bulb with some irregularity. No stenosis. Cervical ICA widely patent. Vertebral arteries: Both vertebral artery origins are widely patent. Both vertebral arteries appear normal through the cervical region to the foramen magnum. Skeleton: Mid cervical spondylosis. Other neck: No soft tissue mass or lymphadenopathy. Upper chest: Normal Review of the MIP images confirms the above findings CTA HEAD FINDINGS Anterior circulation: Both internal carotid arteries widely patent through the skull base and siphon regions. The anterior and middle cerebral vessels are patent. No large or medium vessel occlusion, proximal stenosis, aneurysm or vascular malformation. Posterior circulation: Both vertebral arteries widely  patent to the basilar. No basilar stenosis. Posterior circulation branch vessels are normal. Venous sinuses: Patent and normal. Anatomic variants: None significant. Review of the MIP images confirms the above findings IMPRESSION: No intracranial large or medium vessel occlusion. Atherosclerotic plaque at both carotid bifurcations but without stenosis. Mild irregularity on the left. Electronically Signed   By: Paulina Fusi M.D.   On: 12/13/2020 20:32   CT HEAD CODE STROKE WO CONTRAST  Result Date: 12/13/2020 CLINICAL DATA:  Code stroke. Left-sided weakness. Left facial droop. Left arm and leg weakness. Symptoms began 1200 hours. No speech disturbance. EXAM: CT HEAD WITHOUT CONTRAST TECHNIQUE: Contiguous axial images were obtained from the  base of the skull through the vertex without intravenous contrast. COMPARISON:  None. FINDINGS: Brain: No abnormality seen affecting the brainstem or cerebellum. Dilated perivascular spaces present at the base of the brain on both sides. Cerebral hemispheres otherwise appear normal. No evidence of old or acute large or small vessel infarction. No mass lesion, hemorrhage, hydrocephalus or extra-axial collection. Vascular: No abnormal vascular finding. Skull: Normal Sinuses/Orbits: Clear/normal Other: None ASPECTS (Alberta Stroke Program Early CT Score) - Ganglionic level infarction (caudate, lentiform nuclei, internal capsule, insula, M1-M3 cortex): 7 - Supraganglionic infarction (M4-M6 cortex): 3 Total score (0-10 with 10 being normal): 10 IMPRESSION: 1. Normal head CT. 2. ASPECTS is 10. 3. These results were called by telephone at the time of interpretation on 12/13/2020 at 7:42 pm to provider [facility] , who verbally acknowledged these results. Electronically Signed   By: Paulina Fusi M.D.   On: 12/13/2020 19:48    Procedures Procedures   Medications Ordered in ED Medications  iohexol (OMNIPAQUE) 350 MG/ML injection 75 mL (75 mLs Intravenous Contrast Given 12/13/20  2014)    ED Course  I have reviewed the triage vital signs and the nursing notes.  Pertinent labs & imaging results that were available during my care of the patient were reviewed by me and considered in my medical decision making (see chart for details).    MDM Rules/Calculators/A&P                          The patient has acute neurologic deficits just over 6 hours since onset, he does not qualify for tPA, will discuss with neurology regarding other options including potential for thrombectomy, CT scan of the head pending, EKG shows no signs of atrial fibrillation  This patient has been discussed with Neuro - agrees with activation of code stroke and requests angiograms - formal neuro consult was already requested as a code stroke activation on arrival.  Per radiology CT non con - negative for acute findings.  Vitals:   12/13/20 1902 12/13/20 1904 12/13/20 1919 12/13/20 1940  BP: (!) 149/89   (!) 138/93  Pulse: 64   60  Resp: 14   15  Temp:   (!) 97.5 F (36.4 C)   TempSrc:   Oral   SpO2: 95%   97%  Weight:  78 kg    Height:  1.778 m (5\' 10" )     D/w hospitalist who will admit  Final Clinical Impression(s) / ED Diagnoses Final diagnoses:  Acute ischemic stroke (HCC)      , MD 12/13/20 2137

## 2020-12-13 NOTE — Progress Notes (Signed)
1928 call time 1933 exam started 1935 exam finished 1935 images sent to soc 1937 exam completed in epic 1938 [facility] radiology called

## 2020-12-13 NOTE — H&P (Signed)
TRH H&P    Patient Demographics:    Alexander Warner, is a 60 y.o. male  MRN: [id number]  DOB - Apr 14, 1961  Admit Date - 12/13/2020  Referring MD/NP/PA: Hyacinth Meeker  Outpatient Primary MD for the patient is Sigmund Hazel, MD  Patient coming from: Home  Chief complaint-left arm and leg weakness   HPI:    Alexander Warner  is a 60 y.o. male, with history of high cholesterol, diabetes mellitus type 2, hypertension, who is still very active, comes in today with a left upper and lower extremity weakness.  Patient reports that really after last he was at work when he began to not feel well.  He felt like his body was heavy.  He then noticed that he was having difficulty with proprioception of his left hand, weakness of his left hand and weakness of his left leg.  He denies any foot drop.  He said he rested for a moment at work and then felt better but not 100%.  He reports that his symptoms were constant.  He describes his leg weakness as his legs feeling like a "removal."  He had no paresthesias.  Does report a family history of stroke.  Patient has a history of high blood pressure and reports he has been taking his blood pressure medications as prescribed.  His blood pressure has been running slightly higher than his normal at 150s over high 90s.  Patient reports he recently had a cold and was taking a over-the-counter decongestant.  He did not take any of his decongestant today.  Patient reports he has never had symptoms like this before.  Patient reports that this time he feels back to his baseline, however on exam he does note over sensation in the left upper and lower extremity remains.  It is noted that upon his arrival he had left facial droop.  Patient does not smoke, drinks approximately 1 drink once a week, does not use illicit drugs.  He is not vaccinated for COVID.  Patient prefers to be DNR.  In the ED Temp 97.5, heart rate  60-65, respiratory rate 14-20, blood pressure 135/90, satting at 95% No leukocytosis, hemoglobin 15.3 Chemistry panel on the i-STAT reveals a hyperkalemia, but a comprehensive panel by lab shows a potassium of 4.2.   Patient does have hyperglycemia at 227 CT angio head and neck showed no intracranial large or medium vessel occlusion.  Atherosclerotic plaque at both carotid bifurcations but without stenosis. CT head code stroke without contrast shows normal head CT EKG shows a heart rate of 63, QTc 411, normal sinus rhythm Telemetry neuro recommends admission to hospitalist service for stroke work-up.  Permissive hypertension for 48 hours with a goal of 220/110.  MRI brain.  Echo.  Check A1c and LDL.  Continue 81 mg aspirin.  Start Plavix.    Review of systems:    In addition to the HPI above,  No Fever-chills, No Headache, No changes with Vision or hearing, No problems swallowing food or Liquids, No Chest pain, Cough or Shortness of Breath, No  Abdominal pain, No Nausea or Vomiting, bowel movements are regular, No Blood in stool or Urine, No dysuria, No new skin rashes or bruises, No new joints pains-aches,  No recent weight gain or loss, No polyuria, polydypsia or polyphagia, No significant Mental Stressors.  All other systems reviewed and are negative.    Past History of the following :    Past Medical History:  Diagnosis Date  . Diabetes mellitus without complication (HCC)       History reviewed. No pertinent surgical history.    Social History:      Social History   Tobacco Use  . Smoking status: Never Smoker  . Smokeless tobacco: Never Used  Substance Use Topics  . Alcohol use: Yes    Comment: daily use       Family History :    History reviewed. No pertinent family history.    Home Medications:   Prior to Admission medications   Medication Sig Start Date End Date Taking? Authorizing Provider  aspirin EC 81 MG tablet Take 81 mg by mouth daily.     [provider]  atorvastatin (LIPITOR) 10 MG tablet Take 10 mg by mouth daily.    [provider]  hydrochlorothiazide (HYDRODIURIL) 25 MG tablet Take 1 tablet (25 mg total) by mouth daily. 05/16/14   Linwood Dibbles, MD  losartan (COZAAR) 50 MG tablet Take 50 mg by mouth daily.    [provider]  metFORMIN (GLUCOPHAGE) 500 MG tablet Take by mouth.    [provider]     Allergies:    No Known Allergies   Physical Exam:   Vitals  Blood pressure (!) 137/96, pulse (!) 59, temperature (!) 97.5 F (36.4 C), temperature source Oral, resp. rate 16, height  (1.778 m), weight 78 kg, SpO2 96 %.  1.  General: Patient supine in bed, no acute distress  2. Psychiatric: Alert and oriented x3, exceedingly pleasant, cooperative with exam  3. Neurologic: Cranial nerves II through XII intact, moves all 4 extremities voluntarily, 5 out of 5 strength in the upper and lower extremities bilaterally, hyposensitivity in the left upper and lower extremity remains, normal speech and language, coordination intact in all 4 extremities  4. HEENMT:  Head is atraumatic, normocephalic, pupils are reactive to light, neck is supple, trachea is midline  5. Respiratory : Lungs are clear to auscultation bilaterally without wheezes, rhonchi, rales, no clubbing, no cyanosis  6. Cardiovascular : Heart rate is normal, rhythm is regular, no murmurs rubs or gallops, no peripheral edema  7. Gastrointestinal:  Abdomen is soft, nondistended, nontender to palpation with active bowel sounds  8. Skin:  Skin is warm dry and intact without acute lesion on limited exam  9.Musculoskeletal:  No acute deformity, no calf tenderness, peripheral pulses palpated    Data Review:    CBC Recent Labs  Lab 12/13/20 1916 12/13/20 1934  WBC 6.0  --   HGB 15.3 15.6  HCT 43.9 46.0  PLT 155  --   MCV 91.3  --   MCH 31.8  --   MCHC 34.9  --   RDW 12.8  --   LYMPHSABS 1.7  --   MONOABS  0.5  --   EOSABS 0.1  --   BASOSABS 0.1  --    ------------------------------------------------------------------------------------------------------------------  Results for orders placed or performed during the hospital encounter of 12/13/20 (from the past 48 hour(s))  Resp Panel by RT-PCR (Flu A&B, Covid) Nasopharyngeal Swab  Status: None   Collection Time: 12/13/20  7:16 PM   Specimen: Nasopharyngeal Swab; Nasopharyngeal(NP) swabs in vial transport medium  Result Value Ref Range   SARS Coronavirus 2 by RT PCR NEGATIVE NEGATIVE    Comment: (NOTE) SARS-CoV-2 target nucleic acids are NOT DETECTED.  The SARS-CoV-2 RNA is generally detectable in upper respiratory specimens during the acute phase of infection. The lowest concentration of SARS-CoV-2 viral copies this assay can detect is 138 copies/mL. A negative result does not preclude SARS-Cov-2 infection and should not be used as the sole basis for treatment or other patient management decisions. A negative result may occur with  improper specimen collection/handling, submission of specimen other than nasopharyngeal swab, presence of viral mutation(s) within the areas targeted by this assay, and inadequate number of viral copies(<138 copies/mL). A negative result must be combined with clinical observations, patient history, and epidemiological information. The expected result is Negative.  Fact Sheet for Patients:  BloggerCourse.com  Fact Sheet for Healthcare Providers:  SeriousBroker.it  This test is no t yet approved or cleared by the [place] FDA and  has been authorized for detection and/or diagnosis of SARS-CoV-2 by FDA under an Emergency Use Authorization (EUA). This EUA will remain  in effect (meaning this test can be used) for the duration of the COVID-19 declaration under Section 564(b)(1) of the Act, 21 U.S.C.section 360bbb-3(b)(1), unless the authorization is  terminated  or revoked sooner.       Influenza A by PCR NEGATIVE NEGATIVE   Influenza B by PCR NEGATIVE NEGATIVE    Comment: (NOTE) The Xpert Xpress SARS-CoV-2/FLU/RSV plus assay is intended as an aid in the diagnosis of influenza from Nasopharyngeal swab specimens and should not be used as a sole basis for treatment. Nasal washings and aspirates are unacceptable for Xpert Xpress SARS-CoV-2/FLU/RSV testing.  Fact Sheet for Patients: BloggerCourse.com  Fact Sheet for Healthcare Providers: SeriousBroker.it  This test is not yet approved or cleared by the [place] FDA and has been authorized for detection and/or diagnosis of SARS-CoV-2 by FDA under an Emergency Use Authorization (EUA). This EUA will remain in effect (meaning this test can be used) for the duration of the COVID-19 declaration under Section 564(b)(1) of the Act, 21 U.S.C. section 360bbb-3(b)(1), unless the authorization is terminated or revoked.  Performed at [facility], [address] [place], [place] [address]   Ethanol     Status: None   Collection Time: 12/13/20  7:16 PM  Result Value Ref Range   Alcohol, Ethyl (B) <10 <10 mg/dL    Comment: (NOTE) Lowest detectable limit for serum alcohol is 10 mg/dL.  For medical purposes only. Performed at [facility], [address] [place], [place] [address]   Protime-INR     Status: None   Collection Time: 12/13/20  7:16 PM  Result Value Ref Range   Prothrombin Time 12.3 11.4 - 15.2 seconds   INR 0.9 0.8 - 1.2    Comment: (NOTE) INR goal varies based on device and disease states. Performed at [facility], [address], [place], [place] [address]   APTT     Status: None   Collection Time: 12/13/20  7:16 PM  Result Value Ref Range   aPTT 26 24 - 36 seconds    Comment: Performed at [facility], [address], [place], [place] [address]  CBC     Status: None   Collection Time: 12/13/20  7:16 PM  Result  Value Ref Range   WBC 6.0 4.0 -  10.5 K/uL   RBC 4.81 4.22 - 5.81 MIL/uL   Hemoglobin 15.3 13.0 - 17.0 g/dL   HCT 16.143.9 09.639.0 - 04.552.0 %   MCV 91.3 80.0 - 100.0 fL   MCH 31.8 26.0 - 34.0 pg   MCHC 34.9 30.0 - 36.0 g/dL   RDW 40.912.8 81.111.5 - 91.415.5 %   Platelets 155 150 - 400 K/uL   nRBC 0.0 0.0 - 0.2 %    Comment: Performed at [facility], [address] [place], [place] [address]  Differential     Status: None   Collection Time: 12/13/20  7:16 PM  Result Value Ref Range   Neutrophils Relative % 60 %   Neutro Abs 3.6 1.7 - 7.7 K/uL   Lymphocytes Relative 28 %   Lymphs Abs 1.7 0.7 - 4.0 K/uL   Monocytes Relative 9 %   Monocytes Absolute 0.5 0.1 - 1.0 K/uL   Eosinophils Relative 2 %   Eosinophils Absolute 0.1 0.0 - 0.5 K/uL   Basophils Relative 1 %   Basophils Absolute 0.1 0.0 - 0.1 K/uL   Immature Granulocytes 0 %   Abs Immature Granulocytes 0.01 0.00 - 0.07 K/uL    Comment: Performed at [facility], [address] [place], [place] [address]  Comprehensive metabolic panel     Status: Abnormal   Collection Time: 12/13/20  7:16 PM  Result Value Ref Range   Sodium 135 135 - 145 mmol/L   Potassium 4.2 3.5 - 5.1 mmol/L   Chloride 100 98 - 111 mmol/L   CO2 28 22 - 32 mmol/L   Glucose, Bld 227 (H) 70 - 99 mg/dL    Comment: Glucose reference range applies only to samples taken after fasting for at least 8 hours.   BUN 14 6 - 20 mg/dL   Creatinine, Ser 8.650.85 0.61 - 1.24 mg/dL   Calcium 9.7 8.9 - 78.410.3 mg/dL   Total Protein 7.4 6.5 - 8.1 g/dL   Albumin 4.6 3.5 - 5.0 g/dL   AST 23 15 - 41 U/L   ALT 26 0 - 44 U/L   Alkaline Phosphatase 49 38 - 126 U/L   Total Bilirubin 0.7 0.3 - 1.2 mg/dL   GFR, Estimated >69>60 >62>60 mL/min    Comment: (NOTE) Calculated using the CKD-EPI Creatinine Equation (2021)    Anion gap 7 5 - 15    Comment: Performed at [facility], [address] [place], [place] [address]  CBG monitoring, ED     Status: Abnormal   Collection Time: 12/13/20  7:26 PM  Result Value  Ref Range   Glucose-Capillary 239 (H) 70 - 99 mg/dL    Comment: Glucose reference range applies only to samples taken after fasting for at least 8 hours.  I-stat chem 8, ED     Status: Abnormal   Collection Time: 12/13/20  7:34 PM  Result Value Ref Range   Sodium 134 (L) 135 - 145 mmol/L   Potassium 6.8 (HH) 3.5 - 5.1 mmol/L   Chloride 100 98 - 111 mmol/L   BUN 18 6 - 20 mg/dL   Creatinine, Ser 1.320.70 0.61 - 1.24 mg/dL   Glucose, Bld 440233 (H) 70 - 99 mg/dL    Comment: Glucose reference range applies only to samples taken after fasting for at least 8 hours.   Calcium, Ion 1.16 1.15 - 1.40 mmol/L   TCO2 28 22 - 32 mmol/L   Hemoglobin 15.6 13.0 - 17.0 g/dL   HCT 10.246.0 72.539.0 - 36.652.0 %  Chemistries  Recent Labs  Lab 12/13/20 1916 12/13/20 1934  NA 135 134*  K 4.2 6.8*  CL 100 100  CO2 28  --   GLUCOSE 227* 233*  BUN 14 18  CREATININE 0.85 0.70  CALCIUM 9.7  --   AST 23  --   ALT 26  --   ALKPHOS 49  --   BILITOT 0.7  --    ------------------------------------------------------------------------------------------------------------------  ------------------------------------------------------------------------------------------------------------------ GFR: Estimated Creatinine Clearance: 101.4 mL/min (by C-G formula based on SCr of 0.7 mg/dL). Liver Function Tests: Recent Labs  Lab 12/13/20 1916  AST 23  ALT 26  ALKPHOS 49  BILITOT 0.7  PROT 7.4  ALBUMIN 4.6   No results for input(s): LIPASE, AMYLASE in the last 168 hours. No results for input(s): AMMONIA in the last 168 hours. Coagulation Profile: Recent Labs  Lab 12/13/20 1916  INR 0.9   Cardiac Enzymes: No results for input(s): CKTOTAL, CKMB, CKMBINDEX, TROPONINI in the last 168 hours. BNP (last 3 results) No results for input(s): PROBNP in the last 8760 hours. HbA1C: No results for input(s): HGBA1C in the last 72 hours. CBG: Recent Labs  Lab 12/13/20 1926  GLUCAP 239*   Lipid Profile: No results for  input(s): CHOL, HDL, LDLCALC, TRIG, CHOLHDL, LDLDIRECT in the last 72 hours. Thyroid Function Tests: No results for input(s): TSH, T4TOTAL, FREET4, T3FREE, THYROIDAB in the last 72 hours. Anemia Panel: No results for input(s): VITAMINB12, FOLATE, FERRITIN, TIBC, IRON, RETICCTPCT in the last 72 hours.  --------------------------------------------------------------------------------------------------------------- Urine analysis: No results found for: COLORURINE, APPEARANCEUR, LABSPEC, PHURINE, GLUCOSEU, HGBUR, BILIRUBINUR, KETONESUR, PROTEINUR, UROBILINOGEN, NITRITE, LEUKOCYTESUR    Imaging Results:    CT Angio Head W or Wo Contrast  Result Date: 12/13/2020 CLINICAL DATA:  Left-sided facial droop. Left arm and leg weakness beginning 1200 hours. EXAM: CT ANGIOGRAPHY HEAD AND NECK TECHNIQUE: Multidetector CT imaging of the head and neck was performed using the standard protocol during bolus administration of intravenous contrast. Multiplanar CT image reconstructions and MIPs were obtained to evaluate the vascular anatomy. Carotid stenosis measurements (when applicable) are obtained utilizing NASCET criteria, using the distal internal carotid diameter as the denominator. CONTRAST:  75mL OMNIPAQUE IOHEXOL 350 MG/ML SOLN COMPARISON:  Head CT earlier same day FINDINGS: CTA NECK FINDINGS Aortic arch: Branching pattern of the brachiocephalic vessels from the arch is normal. No origin stenosis. Right carotid system: Common carotid artery widely patent to the bifurcation. Carotid bifurcation shows mild soft plaque but no stenosis or irregularity. Cervical ICA widely patent. Left carotid system: Common carotid artery widely patent to the bifurcation. Soft and calcified plaque at the carotid bifurcation and ICA bulb with some irregularity. No stenosis. Cervical ICA widely patent. Vertebral arteries: Both vertebral artery origins are widely patent. Both vertebral arteries appear normal through the cervical region to  the foramen magnum. Skeleton: Mid cervical spondylosis. Other neck: No soft tissue mass or lymphadenopathy. Upper chest: Normal Review of the MIP images confirms the above findings CTA HEAD FINDINGS Anterior circulation: Both internal carotid arteries widely patent through the skull base and siphon regions. The anterior and middle cerebral vessels are patent. No large or medium vessel occlusion, proximal stenosis, aneurysm or vascular malformation. Posterior circulation: Both vertebral arteries widely patent to the basilar. No basilar stenosis. Posterior circulation branch vessels are normal. Venous sinuses: Patent and normal. Anatomic variants: None significant. Review of the MIP images confirms the above findings IMPRESSION: No intracranial large or medium vessel occlusion. Atherosclerotic plaque at both carotid bifurcations but without stenosis.  Mild irregularity on the left. Electronically Signed   By: Paulina Fusi M.D.   On: 12/13/2020 20:32   CT Angio Neck W and/or Wo Contrast  Result Date: 12/13/2020 CLINICAL DATA:  Left-sided facial droop. Left arm and leg weakness beginning 1200 hours. EXAM: CT ANGIOGRAPHY HEAD AND NECK TECHNIQUE: Multidetector CT imaging of the head and neck was performed using the standard protocol during bolus administration of intravenous contrast. Multiplanar CT image reconstructions and MIPs were obtained to evaluate the vascular anatomy. Carotid stenosis measurements (when applicable) are obtained utilizing NASCET criteria, using the distal internal carotid diameter as the denominator. CONTRAST:  54mL OMNIPAQUE IOHEXOL 350 MG/ML SOLN COMPARISON:  Head CT earlier same day FINDINGS: CTA NECK FINDINGS Aortic arch: Branching pattern of the brachiocephalic vessels from the arch is normal. No origin stenosis. Right carotid system: Common carotid artery widely patent to the bifurcation. Carotid bifurcation shows mild soft plaque but no stenosis or irregularity. Cervical ICA widely  patent. Left carotid system: Common carotid artery widely patent to the bifurcation. Soft and calcified plaque at the carotid bifurcation and ICA bulb with some irregularity. No stenosis. Cervical ICA widely patent. Vertebral arteries: Both vertebral artery origins are widely patent. Both vertebral arteries appear normal through the cervical region to the foramen magnum. Skeleton: Mid cervical spondylosis. Other neck: No soft tissue mass or lymphadenopathy. Upper chest: Normal Review of the MIP images confirms the above findings CTA HEAD FINDINGS Anterior circulation: Both internal carotid arteries widely patent through the skull base and siphon regions. The anterior and middle cerebral vessels are patent. No large or medium vessel occlusion, proximal stenosis, aneurysm or vascular malformation. Posterior circulation: Both vertebral arteries widely patent to the basilar. No basilar stenosis. Posterior circulation branch vessels are normal. Venous sinuses: Patent and normal. Anatomic variants: None significant. Review of the MIP images confirms the above findings IMPRESSION: No intracranial large or medium vessel occlusion. Atherosclerotic plaque at both carotid bifurcations but without stenosis. Mild irregularity on the left. Electronically Signed   By: Paulina Fusi M.D.   On: 12/13/2020 20:32   CT HEAD CODE STROKE WO CONTRAST  Result Date: 12/13/2020 CLINICAL DATA:  Code stroke. Left-sided weakness. Left facial droop. Left arm and leg weakness. Symptoms began 1200 hours. No speech disturbance. EXAM: CT HEAD WITHOUT CONTRAST TECHNIQUE: Contiguous axial images were obtained from the base of the skull through the vertex without intravenous contrast. COMPARISON:  None. FINDINGS: Brain: No abnormality seen affecting the brainstem or cerebellum. Dilated perivascular spaces present at the base of the brain on both sides. Cerebral hemispheres otherwise appear normal. No evidence of old or acute large or small vessel  infarction. No mass lesion, hemorrhage, hydrocephalus or extra-axial collection. Vascular: No abnormal vascular finding. Skull: Normal Sinuses/Orbits: Clear/normal Other: None ASPECTS (Alberta Stroke Program Early CT Score) - Ganglionic level infarction (caudate, lentiform nuclei, internal capsule, insula, M1-M3 cortex): 7 - Supraganglionic infarction (M4-M6 cortex): 3 Total score (0-10 with 10 being normal): 10 IMPRESSION: 1. Normal head CT. 2. ASPECTS is 10. 3. These results were called by telephone at the time of interpretation on 12/13/2020 at 7:42 pm to provider [facility] , who verbally acknowledged these results. Electronically Signed   By: Paulina Fusi M.D.   On: 12/13/2020 19:48    My personal review of EKG: Rhythm NSR, Rate 63/min, QTc 411,no Acute ST changes   Assessment & Plan:    Active Problems:   CVA (cerebral vascular accident) (HCC)   Diabetes mellitus type 2 in nonobese (  HCC)   Hyperlipidemia   Essential hypertension   1. Neurodeficit with acute stroke suspected 1. CT code stroke was a normal head CT 2. CTA head and neck showed no intracranial large or medium vessel occlusion, atherosclerotic plaque at both carotid bifurcations but without stenosis 3. Ischemic stroke order set utilized 4. Physical therapy, Occupational Therapy, speech therapy to see in the a.m. 5. Echo in the a.m. 6. Start Plavix, permissive hypertension 7. Monitor on telemetry 2. Hypertension 1. Hold hydrochlorothiazide and losartan for permissive hypertension 3. Hyperlipidemia 1. Continue statin 4. Diabetes mellitus type 2 1. Hold metformin 2. Sliding scale coverage 3. Carb modified diet 5.    DVT Prophylaxis-Heparin- SCDs   AM Labs Ordered, also please review Full Orders  Family Communication: Admission, patients condition and plan of care including tests being ordered have been discussed with the patient and daughter, [name], who indicate understanding and agree with the plan and Code  Status.  Code Status:  DNR  Admission status: Observation Time spent in minutes : 65  Kattia Selley B Zierle-Ghosh  DO

## 2020-12-13 NOTE — ED Triage Notes (Signed)
Pt with left arm and left leg weakness since 1200, mild numbness to left arm as well.  No slurred speech. Denies any HA

## 2020-12-13 NOTE — ED Notes (Signed)
Tele-neurologist assessing pt.

## 2020-12-13 NOTE — Consult Note (Signed)
NEUROLOGY TELECONSULTATION NOTE   Date of service: Dec 13, 2020 Patient Name: Alexander Warner MRN:  [id number] DOB:  08/26/1960 Reason for consult: L-sided numbness and weakness (face/arm/leg) since 1200  Requesting Provider: Dr. Hyacinth Meeker Consult Participants: Telestroke RN Deanna, Bedside RN Jeanie Cooks, patient Location of the provider: [place], [place] Location of the patient: [facility]  This consult was provided via telemedicine with 2-way video and audio communication. The patient/family was informed that care would be provided in this way and agreed to receive care in this manner.   _ _ _   _ __   _ __ _ _  __ __   _ __   __ _  History of Present Illness   60 yo man with hx DM2, HL, HTN presented to [facility] c/o L-sided numbness and weakness (face/arm/leg) since 1200. He states that sx are about 90% improved from onset but not resolved. He took ASA 325mg  today (typically takes 81mg  daily) but is not on therapeutic anticoagulation. CTH NAICP. NIHSS =3 (facial palsy, sensory, dysarthria). No drift was perceived in any extremity. He was outside the window for tPA. CTA not performed 2/2 exam not c/w LVO.   ROS   10 point review of systems was performed and was negative except as described in HPI.  Past History   Past Medical History:  Diagnosis Date  . Diabetes mellitus without complication (HCC)    History reviewed. No pertinent surgical history. History reviewed. No pertinent family history. Social History   Socioeconomic History  . Marital status: Divorced    Spouse name: Not on file  . Number of children: Not on file  . Years of education: Not on file  . Highest education level: Not on file  Occupational History  . Not on file  Tobacco Use  . Smoking status: Never Smoker  . Smokeless tobacco: Never Used  Substance and Sexual Activity  . Alcohol use: Yes    Comment: daily use  . Drug use: No  . Sexual activity: Not on file  Other Topics Concern  . Not on file  Social  History Narrative  . Not on file   Social Determinants of Health   Financial Resource Strain: Not on file  Food Insecurity: Not on file  Transportation Needs: Not on file  Physical Activity: Not on file  Stress: Not on file  Social Connections: Not on file   No Known Allergies  Medications   (Not in a hospital admission)    Vitals   Vitals:   12/13/20 1940 12/13/20 2000 12/13/20 2030 12/13/20 2100  BP: (!) 138/93 (!) 142/91 (!) 141/91 135/90  Pulse: 60 65 62 64  Resp: 15 20 17 16   Temp:      TempSrc:      SpO2: 97% 98% 97% 95%  Weight:      Height:         Body mass index is 24.68 kg/m.  Physical Exam   Exam performed over telemedicine with 2-way video and audio communication and with assistance of bedside RN  Physical Exam Gen: A&O x4, NAD Resp: normal WOB CV: extremities appear well-perfused  Neuro: *MS: A&O x4. Follows multi-step commands.  *Speech: mild dysarthria, no aphasia, able to name and repeat *CN: PERRL 73mm, EOMI, VFF by confrontation, sensation impaired to L face to LT, L facial droop, hearing intact to voice *Motor:   Normal bulk.  No tremor, rigidity or bradykinesia. No pronator drift. All extremities appear full-strength and symmetric. *Sensory: Sensory  impairment to LT LUE and LLE *Coordination:  Finger-to-nose, heel-to-shin, rapid alternating motions were intact. *Reflexes:  UTA 2/2 tele-exam *Gait: deferred  NIHSS =3 (facial palsy, sensory, dysarthria)   Premorbid mRS = 0   Labs   CBC:  Recent Labs  Lab 12/13/20 1916 12/13/20 1934  WBC 6.0  --   NEUTROABS 3.6  --   HGB 15.3 15.6  HCT 43.9 46.0  MCV 91.3  --   PLT 155  --     Basic Metabolic Panel:  Lab Results  Component Value Date   NA 134 (L) 12/13/2020   K 6.8 (HH) 12/13/2020   CO2 28 12/13/2020   GLUCOSE 233 (H) 12/13/2020   BUN 18 12/13/2020   CREATININE 0.70 12/13/2020   CALCIUM 9.7 12/13/2020   GFRNONAA >60 12/13/2020   GFRAA >90 05/16/2014   Lipid  Panel: No results found for: LDLCALC HgbA1c: No results found for: HGBA1C Urine Drug Screen: No results found for: LABOPIA, COCAINSCRNUR, LABBENZ, AMPHETMU, THCU, LABBARB  Alcohol Level     Component Value Date/Time   ETH <10 12/13/2020 1916     Impression   60 yo man with hx DM2, HL, HTN presented to [facility] c/o L-sided numbness and weakness (face/arm/leg) since 1200. Outside tPA window, admit for stroke/TIA workup.  Recommendations   - Admit to hospitalist service for stroke w/u - In-house neurology consult when available - Permissive HTN x48 hrs from sx onset or until stroke ruled out by MRI goal BP <220/110. PRN labetalol or hydralazine if BP above these parameters. Avoid oral antihypertensives. - MRI brain wo contrast - MRA H&N - TTE - Check A1c and LDL + add statin per guidelines - Continue ASA 81mg  daily (start tmrw, taken today) + plavix 75mg  daily x21 days. After 21 days continue plavix 75mg  monotherapy only - q4 hr neuro checks - STAT head CT for any change in neuro exam - Tele - PT/OT/SLP - Stroke education - Amb referral to neurology upon discharge    ______________________________________________________________________   Thank you for the opportunity to take part in the care of this patient. If you have any further questions, please contact the neurology consultation attending.  Signed,  , MD [facility] [phone]  If 7pm- 7am, please page neurology on call as listed in AMION.

## 2020-12-13 NOTE — ED Notes (Signed)
Dr. Miller in triage to assess pt. ?

## 2020-12-13 NOTE — ED Notes (Signed)
Patient transported to CT 

## 2020-12-13 NOTE — ED Notes (Signed)
Left sided facial droop noted.

## 2020-12-14 ENCOUNTER — Observation Stay (HOSPITAL_COMMUNITY): Payer: BC Managed Care – PPO

## 2020-12-14 ENCOUNTER — Observation Stay (HOSPITAL_BASED_OUTPATIENT_CLINIC_OR_DEPARTMENT_OTHER): Payer: BC Managed Care – PPO

## 2020-12-14 DIAGNOSIS — I69952 Hemiplegia and hemiparesis following unspecified cerebrovascular disease affecting left dominant side: Secondary | ICD-10-CM | POA: Diagnosis not present

## 2020-12-14 DIAGNOSIS — I6389 Other cerebral infarction: Secondary | ICD-10-CM

## 2020-12-14 DIAGNOSIS — E119 Type 2 diabetes mellitus without complications: Secondary | ICD-10-CM | POA: Diagnosis not present

## 2020-12-14 DIAGNOSIS — I10 Essential (primary) hypertension: Secondary | ICD-10-CM | POA: Diagnosis not present

## 2020-12-14 DIAGNOSIS — R29898 Other symptoms and signs involving the musculoskeletal system: Secondary | ICD-10-CM | POA: Diagnosis not present

## 2020-12-14 DIAGNOSIS — E785 Hyperlipidemia, unspecified: Secondary | ICD-10-CM | POA: Diagnosis not present

## 2020-12-14 DIAGNOSIS — I639 Cerebral infarction, unspecified: Secondary | ICD-10-CM | POA: Diagnosis not present

## 2020-12-14 DIAGNOSIS — R531 Weakness: Secondary | ICD-10-CM | POA: Diagnosis not present

## 2020-12-14 LAB — COMPREHENSIVE METABOLIC PANEL
ALT: 27 U/L (ref 0–44)
AST: 21 U/L (ref 15–41)
Albumin: 4.3 g/dL (ref 3.5–5.0)
Alkaline Phosphatase: 42 U/L (ref 38–126)
Anion gap: 8 (ref 5–15)
BUN: 13 mg/dL (ref 6–20)
CO2: 28 mmol/L (ref 22–32)
Calcium: 9.6 mg/dL (ref 8.9–10.3)
Chloride: 102 mmol/L (ref 98–111)
Creatinine, Ser: 0.76 mg/dL (ref 0.61–1.24)
GFR, Estimated: >60 mL/min (ref >60)
Glucose, Bld: 140 mg/dL — ABNORMAL HIGH (ref 70–99)
Potassium: 4.4 mmol/L (ref 3.5–5.1)
Sodium: 138 mmol/L (ref 135–145)
Total Bilirubin: 1.1 mg/dL (ref 0.3–1.2)
Total Protein: 7.2 g/dL (ref 6.5–8.1)

## 2020-12-14 LAB — RAPID URINE DRUG SCREEN, HOSP PERFORMED
Amphetamines: NOT DETECTED
Barbiturates: NOT DETECTED
Benzodiazepines: NOT DETECTED
Cocaine: NOT DETECTED
Opiates: NOT DETECTED
Tetrahydrocannabinol: NOT DETECTED

## 2020-12-14 LAB — HIV ANTIBODY (ROUTINE TESTING W REFLEX): HIV Screen 4th Generation wRfx: NONREACTIVE

## 2020-12-14 LAB — ECHOCARDIOGRAM COMPLETE
Area-P 1/2: 3.46 cm2
Height: 70 in
S' Lateral: 3.02 cm
Weight: 2752 oz

## 2020-12-14 LAB — URINALYSIS, ROUTINE W REFLEX MICROSCOPIC
Bilirubin Urine: NEGATIVE
Glucose, UA: NEGATIVE mg/dL
Hgb urine dipstick: NEGATIVE
Ketones, ur: NEGATIVE mg/dL
Leukocytes,Ua: NEGATIVE
Nitrite: NEGATIVE
Protein, ur: NEGATIVE mg/dL
Specific Gravity, Urine: 1.041 — ABNORMAL HIGH (ref 1.005–1.030)
pH: 6 (ref 5.0–8.0)

## 2020-12-14 LAB — HEMOGLOBIN A1C
Hgb A1c MFr Bld: 6.2 % — ABNORMAL HIGH (ref 4.8–5.6)
Mean Plasma Glucose: 131 mg/dL

## 2020-12-14 LAB — POCT I-STAT, CHEM 8
BUN: 18 mg/dL (ref 6–20)
Calcium, Ion: 1.16 mmol/L (ref 1.15–1.40)
Chloride: 100 mmol/L (ref 98–111)
Creatinine, Ser: 0.7 mg/dL (ref 0.61–1.24)
Glucose, Bld: 233 mg/dL — ABNORMAL HIGH (ref 70–99)
HCT: 46 % (ref 39.0–52.0)
Hemoglobin: 15.6 g/dL (ref 13.0–17.0)
Potassium: 6.8 mmol/L (ref 3.5–5.1)
Sodium: 134 mmol/L — ABNORMAL LOW (ref 135–145)
TCO2: 28 mmol/L (ref 22–32)

## 2020-12-14 LAB — CBC
HCT: 44.5 % (ref 39.0–52.0)
Hemoglobin: 15.2 g/dL (ref 13.0–17.0)
MCH: 31.9 pg (ref 26.0–34.0)
MCHC: 34.2 g/dL (ref 30.0–36.0)
MCV: 93.3 fL (ref 80.0–100.0)
Platelets: 129 10*3/uL — ABNORMAL LOW (ref 150–400)
RBC: 4.77 MIL/uL (ref 4.22–5.81)
RDW: 13.1 % (ref 11.5–15.5)
WBC: 4.9 10*3/uL (ref 4.0–10.5)
nRBC: 0 % (ref 0.0–0.2)

## 2020-12-14 LAB — LIPID PANEL
Cholesterol: 162 mg/dL (ref 0–200)
HDL: 74 mg/dL (ref >40)
LDL Cholesterol: 64 mg/dL (ref 0–99)
Total CHOL/HDL Ratio: 2.2 RATIO
Triglycerides: 120 mg/dL (ref <150)
VLDL: 24 mg/dL (ref 0–40)

## 2020-12-14 LAB — GLUCOSE, CAPILLARY
Glucose-Capillary: 148 mg/dL — ABNORMAL HIGH (ref 70–99)
Glucose-Capillary: 120 mg/dL — ABNORMAL HIGH (ref 70–99)
Glucose-Capillary: 120 mg/dL — ABNORMAL HIGH (ref 70–99)
Glucose-Capillary: 149 mg/dL — ABNORMAL HIGH (ref 70–99)

## 2020-12-14 NOTE — Evaluation (Signed)
Occupational Therapy Evaluation Patient Details Name: Alexander Warner MRN: [id number] DOB: 1961/06/05 Today's Date: 12/14/2020    History of Present Illness Alexander Warner  is a 60 y.o. male, with history of high cholesterol, diabetes mellitus type 2, hypertension, who is still very active, comes in today with a left upper and lower extremity weakness.  Patient reports that really after last he was at work when he began to not feel well.  He felt like his body was heavy.  He then noticed that he was having difficulty with proprioception of his left hand, weakness of his left hand and weakness of his left leg.  He denies any foot drop.  He said he rested for a moment at work and then felt better but not 100%.  He reports that his symptoms were constant.  He describes his leg weakness as his legs feeling like a "removal."  He had no paresthesias.  Does report a family history of stroke.  Patient has a history of high blood pressure and reports he has been taking his blood pressure medications as prescribed.  His blood pressure has been running slightly higher than his normal at 150s over high 90s.  Patient reports he recently had a cold and was taking a over-the-counter decongestant.  He did not take any of his decongestant today.  Patient reports he has never had symptoms like this before.   Clinical Impression     Pt agreeable to OT/PT co-evaluation this date. Pt able to complete donning of shoes with independence at EOB. Pt demonstrates bed mobility, transfers, and functional ambulation with independence. Pt primary deficit appears to be L UE decreased sensation. Pt reports decreased sensation to light touch in L UE from elbow to hand. Pt is not recommended for further acute OT services and will be discharged to care of nursing staff for the remainder of his stay.          Follow Up Recommendations  No OT follow up    Equipment Recommendations  None recommended by OT           Precautions /  Restrictions Precautions Precautions: None Restrictions Weight Bearing Restrictions: No      Mobility Bed Mobility Overal bed mobility: Independent                  Transfers Overall transfer level: Independent               General transfer comment: Independent sit to stand, ambulation, and return to EOB.    Balance Overall balance assessment: Independent                                         ADL either performed or assessed with clinical judgement   ADL Overall ADL's : Independent                                       General ADL Comments: donning shoes at EOB     Vision Baseline Vision/History: Wears glasses Wears Glasses:  (intermittently; driving) Patient Visual Report: No change from baseline Vision Assessment?: No apparent visual deficits                Pertinent Vitals/Pain Pain Assessment: No/denies pain     Hand Dominance Right   Extremity/Trunk Assessment Upper Extremity  Assessment Upper Extremity Assessment: Overall WFL for tasks assessed;LUE deficits/detail LUE Deficits / Details: Mild sensation deficits in L hand. Decreased light touch from elbow to hand on L UE. LUE Sensation: decreased light touch LUE Coordination: WNL   Lower Extremity Assessment Lower Extremity Assessment: Defer to PT evaluation   Cervical / Trunk Assessment Cervical / Trunk Assessment: Normal   Communication Communication Communication: No difficulties   Cognition Arousal/Alertness: Awake/alert Behavior During Therapy: WFL for tasks assessed/performed Overall Cognitive Status: Within Functional Limits for tasks assessed                                                      Home Living Family/patient expects to be discharged to:: Private residence Living Arrangements: Alone Available Help at Discharge: Family;Available PRN/intermittently Type of Home: Mobile home Home Access: Stairs to  enter Entrance Stairs-Number of Steps: 2 Entrance Stairs-Rails: Left Home Layout: One level     Bathroom Shower/Tub: Chief Strategy Officer: Handicapped height Bathroom Accessibility: Yes How Accessible: Accessible via walker Home Equipment: None          Prior Functioning/Environment Level of Independence: Independent        Comments: Pt independent for all ADL's and IADL's                      OT Goals(Current goals can be found in the care plan section) Acute Rehab OT Goals Patient Stated Goal: return home  OT Frequency:                 Co-evaluation PT/OT/SLP Co-Evaluation/Treatment: Yes Reason for Co-Treatment: To address functional/ADL transfers   OT goals addressed during session: ADL's and self-care;Strengthening/ROM      AM-PAC OT "6 Clicks" Daily Activity     Outcome Measure Help from another person eating meals?: None Help from another person taking care of personal grooming?: None Help from another person toileting, which includes using toliet, bedpan, or urinal?: None Help from another person bathing (including washing, rinsing, drying)?: None Help from another person to put on and taking off regular upper body clothing?: None Help from another person to put on and taking off regular lower body clothing?: None 6 Click Score: 24   End of Session    Activity Tolerance: Patient tolerated treatment well Patient left: in bed;with call bell/phone within reach;with nursing/sitter in room  OT Visit Diagnosis: Other symptoms and signs involving the nervous system (T61.443)                Time: 1540-0867 OT Time Calculation (min): 11 min Charges:  OT General Charges $OT Visit: 1 Visit OT Evaluation $OT Eval Low Complexity: 1 Low  Ivery Nanney OT, MOT   Danie Chandler 12/14/2020, 9:34 AM

## 2020-12-14 NOTE — Progress Notes (Signed)
PROGRESS NOTE   Alexander Warner  GNF:[id number] DOB: [birth year] DOA: 12/13/2020 PCP: Sigmund Hazel, MD   Chief Complaint  Patient presents with  . Extremity Weakness   Level of care: Telemetry  Brief Admission History:   60 y.o. male, with history of high cholesterol, diabetes mellitus type 2, hypertension, who is still very active, comes in today with a left upper and lower extremity weakness.  Patient reports that really after last he was at work when he began to not feel well.  He felt like his body was heavy.  He then noticed that he was having difficulty with proprioception of his left hand, weakness of his left hand and weakness of his left leg.  He denies any foot drop.  He said he rested for a moment at work and then felt better but not 100%.  He reports that his symptoms were constant.  He describes his leg weakness as his legs feeling like a "removal."  He had no paresthesias.  Does report a family history of stroke.  Patient has a history of high blood pressure and reports he has been taking his blood pressure medications as prescribed.  His blood pressure has been running slightly higher than his normal at 150s over high 90s.  Patient reports he recently had a cold and was taking a over-the-counter decongestant.  He did not take any of his decongestant today.  Patient reports he has never had symptoms like this before.  Assessment & Plan:   Active Problems:   CVA (cerebral vascular accident) (HCC)   Diabetes mellitus type 2 in nonobese (HCC)   Hyperlipidemia   Essential hypertension  Acute CVA - Fortunately symptoms have resolved.  He remains on aspirin and plavix.  Neuro checks.  Pt reports feeling much better.  He has been started on atorvastatin.  PT/OT eval completed, no follow up recommended.  Inpatient neurology consultation pending.    Essential hypertension - permissive  Hypertension, holding home meds.   Hyperlipidemia - atorvastatin 10 mg daily ordered.   Type 2 DM - a1c  pending, holding home metformin.    DVT prophylaxis: SCD Code Status: Full  Family Communication: none present Disposition: home  Status is: Observation  The patient remains OBS appropriate and will d/c before 2 midnights.  Dispo: The patient is from: Home              Anticipated d/c is to: Home              Patient currently is medically stable to d/c.   Difficult to place patient No   Consultants:   neurology  Procedures:   n/a  Antimicrobials:  n/a  Subjective: Pt reports that he is feeling great at this time, his weaknesses have completely resolved.    Objective: Vitals:   12/14/20 0512 12/14/20 0715 12/14/20 0902 12/14/20 1459  BP: 133/87 130/82 (!) 132/92 (!) 147/95  Pulse: 72 64 (!) 59 61  Resp: Temp: 98.6 F (37 C) 98.4 F (36.9 C) 98.1 F (36.7 C) 98.4 F (36.9 C)  TempSrc:   Oral Oral  SpO2: 99% 99% 97% 98%  Weight:      Height:        Intake/Output Summary (Last 24 hours) at 12/14/2020 1605 Last data filed at 12/14/2020 0500 Gross per 24 hour  Intake --  Output 500 ml  Net -500 ml   Filed Weights   12/13/20 1904  Weight: 78 kg   Examination:  General exam: Appears calm and comfortable.   Respiratory system: Clear to auscultation. Respiratory effort normal. Cardiovascular system: normal S1 & S2 heard. No JVD, murmurs, rubs, gallops or clicks. No pedal edema. Gastrointestinal system: Abdomen is nondistended, soft and nontender. No organomegaly or masses felt. Normal bowel sounds heard. Central nervous system: Alert and oriented. No focal neurological deficits. Extremities: Symmetric 5 x 5 power. Skin: No rashes, lesions or ulcers Psychiatry: Judgement and insight appear normal. Mood & affect appropriate.   Data Reviewed: I have personally reviewed following labs and imaging studies  CBC: Recent Labs  Lab 12/13/20 1916 12/13/20 1934 12/14/20 0538  WBC 6.0  --  4.9  NEUTROABS 3.6  --   --   HGB 15.3 15.6  15.6 15.2   HCT 43.9 46.0  46.0 44.5  MCV 91.3  --  93.3  PLT 155  --  129*    Basic Metabolic Panel: Recent Labs  Lab 12/13/20 1916 12/13/20 1934 12/14/20 0538  NA 135 134*  134* 138  K 4.2 6.8*  6.8* 4.4  CL 100 100  100 102  CO2 28  --  28  GLUCOSE 227* 233*  233* 140*  BUN 14 18  18 13   CREATININE 0.85 0.70  0.70 0.76  CALCIUM 9.7  --  9.6    GFR: Estimated Creatinine Clearance: 101.4 mL/min (by C-G formula based on SCr of 0.76 mg/dL).  Liver Function Tests: Recent Labs  Lab 12/13/20 1916 12/14/20 0538  AST 23 21  ALT 26 27  ALKPHOS 49 42  BILITOT 0.7 1.1  PROT 7.4 7.2  ALBUMIN 4.6 4.3    CBG: Recent Labs  Lab 12/13/20 1926 12/13/20 2311 12/14/20 0719 12/14/20 1058  GLUCAP 239* 142* 148* 120*    Recent Results (from the past 240 hour(s))  Resp Panel by RT-PCR (Flu A&B, Covid) Nasopharyngeal Swab     Status: None   Collection Time: 12/13/20  7:16 PM   Specimen: Nasopharyngeal Swab; Nasopharyngeal(NP) swabs in vial transport medium  Result Value Ref Range Status   SARS Coronavirus 2 by RT PCR NEGATIVE NEGATIVE Final    Comment: (NOTE) SARS-CoV-2 target nucleic acids are NOT DETECTED.  The SARS-CoV-2 RNA is generally detectable in upper respiratory specimens during the acute phase of infection. The lowest concentration of SARS-CoV-2 viral copies this assay can detect is 138 copies/mL. A negative result does not preclude SARS-Cov-2 infection and should not be used as the sole basis for treatment or other patient management decisions. A negative result may occur with  improper specimen collection/handling, submission of specimen other than nasopharyngeal swab, presence of viral mutation(s) within the areas targeted by this assay, and inadequate number of viral copies(<138 copies/mL). A negative result must be combined with clinical observations, patient history, and epidemiological information. The expected result is Negative.  Fact Sheet for Patients:   BloggerCourse.comhttps://www.fda.gov/media/152166/download  Fact Sheet for Healthcare Providers:  SeriousBroker.ithttps://www.fda.gov/media/152162/download  This test is no t yet approved or cleared by the [place] FDA and  has been authorized for detection and/or diagnosis of SARS-CoV-2 by FDA under an Emergency Use Authorization (EUA). This EUA will remain  in effect (meaning this test can be used) for the duration of the COVID-19 declaration under Section 564(b)(1) of the Act, 21 U.S.C.section 360bbb-3(b)(1), unless the authorization is terminated  or revoked sooner.       Influenza A by PCR NEGATIVE NEGATIVE Final   Influenza B by PCR NEGATIVE NEGATIVE Final    Comment: (NOTE) The Xpert  Xpress SARS-CoV-2/FLU/RSV plus assay is intended as an aid in the diagnosis of influenza from Nasopharyngeal swab specimens and should not be used as a sole basis for treatment. Nasal washings and aspirates are unacceptable for Xpert Xpress SARS-CoV-2/FLU/RSV testing.  Fact Sheet for Patients: BloggerCourse.com  Fact Sheet for Healthcare Providers: SeriousBroker.it  This test is not yet approved or cleared by the [place] FDA and has been authorized for detection and/or diagnosis of SARS-CoV-2 by FDA under an Emergency Use Authorization (EUA). This EUA will remain in effect (meaning this test can be used) for the duration of the COVID-19 declaration under Section 564(b)(1) of the Act, 21 U.S.C. section 360bbb-3(b)(1), unless the authorization is terminated or revoked.  Performed at [facility], [address], [place], [place] [address]      Radiology Studies: CT Angio Head W or Wo Contrast  Result Date: 12/13/2020 CLINICAL DATA:  Left-sided facial droop. Left arm and leg weakness beginning 1200 hours. EXAM: CT ANGIOGRAPHY HEAD AND NECK TECHNIQUE: Multidetector CT imaging of the head and neck was performed using the standard protocol during bolus  administration of intravenous contrast. Multiplanar CT image reconstructions and MIPs were obtained to evaluate the vascular anatomy. Carotid stenosis measurements (when applicable) are obtained utilizing NASCET criteria, using the distal internal carotid diameter as the denominator. CONTRAST:  60mL OMNIPAQUE IOHEXOL 350 MG/ML SOLN COMPARISON:  Head CT earlier same day FINDINGS: CTA NECK FINDINGS Aortic arch: Branching pattern of the brachiocephalic vessels from the arch is normal. No origin stenosis. Right carotid system: Common carotid artery widely patent to the bifurcation. Carotid bifurcation shows mild soft plaque but no stenosis or irregularity. Cervical ICA widely patent. Left carotid system: Common carotid artery widely patent to the bifurcation. Soft and calcified plaque at the carotid bifurcation and ICA bulb with some irregularity. No stenosis. Cervical ICA widely patent. Vertebral arteries: Both vertebral artery origins are widely patent. Both vertebral arteries appear normal through the cervical region to the foramen magnum. Skeleton: Mid cervical spondylosis. Other neck: No soft tissue mass or lymphadenopathy. Upper chest: Normal Review of the MIP images confirms the above findings CTA HEAD FINDINGS Anterior circulation: Both internal carotid arteries widely patent through the skull base and siphon regions. The anterior and middle cerebral vessels are patent. No large or medium vessel occlusion, proximal stenosis, aneurysm or vascular malformation. Posterior circulation: Both vertebral arteries widely patent to the basilar. No basilar stenosis. Posterior circulation branch vessels are normal. Venous sinuses: Patent and normal. Anatomic variants: None significant. Review of the MIP images confirms the above findings IMPRESSION: No intracranial large or medium vessel occlusion. Atherosclerotic plaque at both carotid bifurcations but without stenosis. Mild irregularity on the left. Electronically Signed    By: Paulina Fusi M.D.   On: 12/13/2020 20:32   CT Angio Neck W and/or Wo Contrast  Result Date: 12/13/2020 CLINICAL DATA:  Left-sided facial droop. Left arm and leg weakness beginning 1200 hours. EXAM: CT ANGIOGRAPHY HEAD AND NECK TECHNIQUE: Multidetector CT imaging of the head and neck was performed using the standard protocol during bolus administration of intravenous contrast. Multiplanar CT image reconstructions and MIPs were obtained to evaluate the vascular anatomy. Carotid stenosis measurements (when applicable) are obtained utilizing NASCET criteria, using the distal internal carotid diameter as the denominator. CONTRAST:  74mL OMNIPAQUE IOHEXOL 350 MG/ML SOLN COMPARISON:  Head CT earlier same day FINDINGS: CTA NECK FINDINGS Aortic arch: Branching pattern of the brachiocephalic vessels from the arch is normal. No origin stenosis. Right carotid system: Common  carotid artery widely patent to the bifurcation. Carotid bifurcation shows mild soft plaque but no stenosis or irregularity. Cervical ICA widely patent. Left carotid system: Common carotid artery widely patent to the bifurcation. Soft and calcified plaque at the carotid bifurcation and ICA bulb with some irregularity. No stenosis. Cervical ICA widely patent. Vertebral arteries: Both vertebral artery origins are widely patent. Both vertebral arteries appear normal through the cervical region to the foramen magnum. Skeleton: Mid cervical spondylosis. Other neck: No soft tissue mass or lymphadenopathy. Upper chest: Normal Review of the MIP images confirms the above findings CTA HEAD FINDINGS Anterior circulation: Both internal carotid arteries widely patent through the skull base and siphon regions. The anterior and middle cerebral vessels are patent. No large or medium vessel occlusion, proximal stenosis, aneurysm or vascular malformation. Posterior circulation: Both vertebral arteries widely patent to the basilar. No basilar stenosis. Posterior  circulation branch vessels are normal. Venous sinuses: Patent and normal. Anatomic variants: None significant. Review of the MIP images confirms the above findings IMPRESSION: No intracranial large or medium vessel occlusion. Atherosclerotic plaque at both carotid bifurcations but without stenosis. Mild irregularity on the left. Electronically Signed   By: Paulina Fusi M.D.   On: 12/13/2020 20:32   MR BRAIN WO CONTRAST  Result Date: 12/14/2020 CLINICAL DATA:  Neuro deficit, acute stroke suspected. Left-sided weakness. EXAM: MRI HEAD WITHOUT CONTRAST TECHNIQUE: Multiplanar, multiecho pulse sequences of the brain and surrounding structures were obtained without intravenous contrast. COMPARISON:  None. FINDINGS: Brain: Small acute infarct in the right pons (series 5 and 6, images 11/12). Slight edema without mass effect. No acute hemorrhage. No hydrocephalus, extra-axial fluid collection, mass lesion or mass effect. Mild for age scattered T2/FLAIR hyperintensities, nonspecific but most likely related to chronic microvascular ischemic disease. Bilateral inferior basal ganglia dilated perivascular spaces. Vascular: Major arterial flow voids are maintained at the skull base. Skull and upper cervical spine: Normal marrow signal. Sinuses/Orbits: Mild ethmoid air cell mucosal thickening and small left maxillary sinus retention cyst. Otherwise, clear visualized sinuses. Unremarkable orbits. Other: No mastoid effusions. IMPRESSION: Small acute infarct in the right pons. Slight edema without mass effect. Electronically Signed   By: Feliberto Harts MD   On: 12/14/2020 10:00   ECHOCARDIOGRAM COMPLETE  Result Date: 12/14/2020    ECHOCARDIOGRAM REPORT   Patient Name:   Alexander Warner Date of Exam: 12/14/2020 Medical Rec #:  [id number]  Height:       70.0 in Accession #:    [id number] Weight:       172.0 lb Date of Birth:  [birth year] BSA:          1.958 m Patient Age:    59 years   BP:           130/82 mmHg Patient Gender: M           HR:           64 bpm. [facility] Procedure: 2D Echo Indications:    Stroke I63.9  History:        Patient has no prior history of Echocardiogram examinations.                 Stroke; Risk Factors:Hypertension, Diabetes, Dyslipidemia and                 Non-Smoker.  Sonographer:    Jeryl Columbia RDCS (AE) Referring Phys: [id number] ASIA B ZIERLE-GHOSH IMPRESSIONS  1. Left ventricular ejection fraction, by estimation, is 60 to 65%. The  left ventricle has normal function. The left ventricle has no regional wall motion abnormalities. Left ventricular diastolic parameters were normal.  2. Right ventricular systolic function is low normal. The right ventricular size is normal. Tricuspid regurgitation signal is inadequate for assessing PA pressure.  3. The mitral valve is grossly normal. Mild mitral valve regurgitation.  4. The aortic valve is tricuspid. Aortic valve regurgitation is not visualized.  5. The inferior vena cava is normal in size with greater than 50% respiratory variability, suggesting right atrial pressure of 3 mmHg. FINDINGS  Left Ventricle: Left ventricular ejection fraction, by estimation, is 60 to 65%. The left ventricle has normal function. The left ventricle has no regional wall motion abnormalities. The left ventricular internal cavity size was normal in size. There is  borderline left ventricular hypertrophy. Left ventricular diastolic parameters were normal. Right Ventricle: The right ventricular size is normal. No increase in right ventricular wall thickness. Right ventricular systolic function is low normal. Tricuspid regurgitation signal is inadequate for assessing PA pressure. Left Atrium: Left atrial size was normal in size. Right Atrium: Right atrial size was normal in size. Pericardium: There is no evidence of pericardial effusion. Mitral Valve: The mitral valve is grossly normal. Mild mitral valve regurgitation. Tricuspid Valve: The tricuspid valve is grossly normal.  Tricuspid valve regurgitation is trivial. Aortic Valve: The aortic valve is tricuspid. There is mild aortic valve annular calcification. Aortic valve regurgitation is not visualized. Pulmonic Valve: The pulmonic valve was grossly normal. Pulmonic valve regurgitation is trivial. Aorta: The aortic root is normal in size and structure. Venous: The inferior vena cava is normal in size with greater than 50% respiratory variability, suggesting right atrial pressure of 3 mmHg. IAS/Shunts: No atrial level shunt detected by color flow Doppler.  LEFT VENTRICLE PLAX 2D LVIDd:         4.72 cm  Diastology LVIDs:         3.02 cm  LV e' medial:    8.92 cm/s LV PW:         1.07 cm  LV E/e' medial:  10.3 LV IVS:        0.97 cm  LV e' lateral:   11.30 cm/s LVOT diam:     1.90 cm  LV E/e' lateral: 8.1 LVOT Area:     2.84 cm  RIGHT VENTRICLE RV S prime:     13.90 cm/s TAPSE (M-mode): 2.7 cm LEFT ATRIUM             Index       RIGHT ATRIUM           Index LA diam:        3.10 cm 1.58 cm/m  RA Area:     13.50 cm LA Vol (A2C):   40.7 ml 20.79 ml/m RA Volume:   35.30 ml  18.03 ml/m LA Vol (A4C):   38.9 ml 19.87 ml/m LA Biplane Vol: 40.9 ml 20.89 ml/m   AORTA Ao Root diam: 3.20 cm MITRAL VALVE MV Area (PHT): 3.46 cm    SHUNTS MV Decel Time: 219 msec    Systemic Diam: 1.90 cm MV E velocity: 91.90 cm/s MV A velocity: 55.80 cm/s MV E/A ratio:  1.65 Nona Dell MD Electronically signed by Nona Dell MD Signature Date/Time: 12/14/2020/12:17:08 PM    Final    CT HEAD CODE STROKE WO CONTRAST  Result Date: 12/13/2020 CLINICAL DATA:  Code stroke. Left-sided weakness. Left facial droop. Left arm and leg weakness. Symptoms began 1200 hours. No  speech disturbance. EXAM: CT HEAD WITHOUT CONTRAST TECHNIQUE: Contiguous axial images were obtained from the base of the skull through the vertex without intravenous contrast. COMPARISON:  None. FINDINGS: Brain: No abnormality seen affecting the brainstem or cerebellum. Dilated perivascular  spaces present at the base of the brain on both sides. Cerebral hemispheres otherwise appear normal. No evidence of old or acute large or small vessel infarction. No mass lesion, hemorrhage, hydrocephalus or extra-axial collection. Vascular: No abnormal vascular finding. Skull: Normal Sinuses/Orbits: Clear/normal Other: None ASPECTS (Alberta Stroke Program Early CT Score) - Ganglionic level infarction (caudate, lentiform nuclei, internal capsule, insula, M1-M3 cortex): 7 - Supraganglionic infarction (M4-M6 cortex): 3 Total score (0-10 with 10 being normal): 10 IMPRESSION: 1. Normal head CT. 2. ASPECTS is 10. 3. These results were called by telephone at the time of interpretation on 12/13/2020 at 7:42 pm to provider [facility] , who verbally acknowledged these results. Electronically Signed   By: Paulina Fusi M.D.   On: 12/13/2020 19:48    Scheduled Meds: .  stroke: mapping our early stages of recovery book   Does not apply Once  . aspirin EC  81 mg Oral Daily  . atorvastatin  10 mg Oral Daily  . clopidogrel  75 mg Oral Daily  . heparin  5,000 Units Subcutaneous Q8H  . insulin aspart  0-15 Units Subcutaneous TID WC  . insulin aspart  0-5 Units Subcutaneous QHS   Continuous Infusions:   LOS: 0 days   Time spent: 37 mins  Maryana Pittmon Laural Benes, MD How to contact the [facility] Attending or Consulting provider 7A - 7P or covering provider during after hours 7P -7A, for this patient?  1. Check the care team in [facility] and look for a) attending/consulting [facility] provider listed and b) the [facility] team listed 2. Log into [email or web address] and use [place]'s universal password to access. If you do not have the password, please contact the hospital operator. 3. Locate the [facility] provider you are looking for under [facility] and page to a number that you can be directly reached. 4. If you still have difficulty reaching the provider, please page the [facility] (Director on Call) for the Hospitalists listed on amion for  assistance.  12/14/2020, 4:05 PM

## 2020-12-14 NOTE — Progress Notes (Signed)
*  PRELIMINARY RESULTS* Echocardiogram 2D Echocardiogram has been performed.  Alexander Warner 12/14/2020, 9:02 AM

## 2020-12-14 NOTE — Evaluation (Signed)
Speech Language Pathology Evaluation Patient Details Name: Alexander Warner MRN: [id number] DOB: 1961-03-13 Today's Date: 12/14/2020 Time: 7341-9379 SLP Time Calculation (min) (ACUTE ONLY): 14 min  Problem List:  Patient Active Problem List   Diagnosis Date Noted  . CVA (cerebral vascular accident) (HCC) 12/13/2020  . Diabetes mellitus type 2 in nonobese (HCC) 12/13/2020  . Hyperlipidemia 12/13/2020  . Essential hypertension 12/13/2020  . Acute ischemic stroke [facility])    Past Medical History:  Past Medical History:  Diagnosis Date  . Diabetes mellitus without complication [facility])    Past Surgical History: History reviewed. No pertinent surgical history. HPI:  60 y.o. male, with history of high cholesterol, diabetes mellitus type 2, hypertension, who is still very active, comes in today with a left upper and lower extremity weakness.  Patient reports that really after last he was at work when he began to not feel well.  He felt like his body was heavy.  He then noticed that he was having difficulty with proprioception of his left hand, weakness of his left hand and weakness of his left leg.  He denies any foot drop.  He said he rested for a moment at work and then felt better but not 100%.  He reports that his symptoms were constant.  He describes his leg weakness as his legs feeling like a "removal." MRI reveals an acute CVA.   Assessment / Plan / Recommendation Clinical Impression  Pt was evaluated by means of the [facility] SLUMS achieving a score of 26/30 indicating mild impairment. Pt reports he feels a "little fuzzy" still. Verbal expression, motor speech and most cognitive functions are intact, however, Pt had mild difficulty with memory and thought organization. 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 There are no further ST needs noted while in the acute setting. ST will sign off. Thank you.    SLP Assessment  SLP Recommendation/Assessment: All further Speech Lanaguage Pathology  needs can be addressed in the next venue of care SLP Visit Diagnosis: Cognitive communication deficit (R41.841)    Follow Up Recommendations  Home health SLP;Outpatient SLP (if mild cognitive changes do not resolve)          SLP Evaluation Cognition  Overall Cognitive Status: Within Functional Limits for tasks assessed Arousal/Alertness: Awake/alert Orientation Level: Oriented X4 Attention: Focused Memory: Impaired Memory Impairment: Decreased short term memory Awareness: Appears intact Problem Solving: Appears intact Executive Function: Reasoning       Comprehension       Expression Expression Primary Mode of Expression: Verbal Verbal Expression Overall Verbal Expression: Appears within functional limits for tasks assessed Written Expression Dominant Hand: Right   Oral / Motor  Oral Motor/Sensory Function Overall Oral Motor/Sensory Function: Within functional limits Motor Speech Overall Motor Speech: Appears within functional limits for tasks assessed     Carmine Carrozza H. Romie Levee, CCC-SLP Speech Language Pathologist  Georgetta Haber 12/14/2020, 9:05 PM

## 2020-12-14 NOTE — Consult Note (Signed)
[facility] Alexander Warner A. Alexander Pilgrim, MD     [email or web address]          Alexander Warner is an 60 y.o. male.   ASSESSMENT/PLAN: 1. 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 Risk factors hypertension, diabetes and age. Dual antiplatelet agents are recommended for 1 month. Subsequently a single agent preferably aspirin 81 mg should suffice. Continue with blood pressure and the blood sugar control.    [name] who presents with the acute onset of a numbness, tingling and weakness of the left side. The patient does not report having dysarthria but his daughter sitting beside him in the exam room indicate that he did have some dysarthria. Appears that the symptoms lasted for few hours but has gradually improved any think is back to normal at this time. Patient was not taking any antiplatelet agents on a consistent basis. He took aspirin episodically for headaches. He denies any associated headaches, dizziness chest pain or palpitation or shortness of breath with current presentation. He has recently lost about 40 lb on purpose after being diagnosed with diabetes mellitus. The review systems otherwise negative.     GENERAL: He is doing well this time.  HEENT:  Neck is supple no trauma noted.  ABDOMEN: soft  EXTREMITIES: No edema   BACK: Normal  SKIN: Normal by inspection.    MENTAL STATUS: Alert and oriented. Speech, language and cognition are generally intact. Judgment and insight normal.   CRANIAL NERVES: Pupils are equal, round and reactive to light and accomodation; extra ocular movements are full, there is no significant nystagmus; visual fields are full; upper and lower facial muscles are normal in strength and symmetric, there is no flattening of the nasolabial folds; tongue is midline; uvula is midline; shoulder elevation is normal.  MOTOR: Normal tone, bulk and  strength; no pronator drift. There is no drift of the upper lower extremities.  COORDINATION: Left finger to nose is normal, right finger to nose is normal, No rest tremor; no intention tremor; no postural tremor; no bradykinesia.  REFLEXES: Deep tendon reflexes are symmetrical and normal.   SENSATION: Normal to light touch, temperature, and pain. The patient does not extinguish on double simultaneous stimulation.   NIH stroke scale 0.   Blood pressure (!) 147/95, pulse 61, temperature 98.4 F (36.9 C), temperature source Oral, resp. rate 18, height 5\' 10"  (1.778 m), weight 78 kg, SpO2 98 %.  Past Medical History:  Diagnosis Date  . Diabetes mellitus without complication (HCC)     History reviewed. No pertinent surgical history.  History reviewed. No pertinent family history.  Social History:  reports that he has never smoked. He has never used smokeless tobacco. He reports current alcohol use. He reports that he does not use drugs.  Allergies:  Allergies  Allergen Reactions  . Glimepiride Other (See Comments)    Pt states he didn't like the way it made him feel    Medications: Prior to Admission medications   Medication Sig Start Date End Date Taking? Authorizing Provider  aspirin EC 81 MG tablet Take 81 mg by mouth daily as needed for mild pain.   Yes [provider]  atorvastatin (LIPITOR) 10 MG tablet Take 10 mg by mouth daily.   Yes [provider]  hydrochlorothiazide (HYDRODIURIL) 25 MG tablet Take 1 tablet (25 mg total) by mouth daily. 05/16/14  Yes 05/18/14, MD  losartan (COZAAR) 100  MG tablet Take 1 tablet by mouth daily. 10/12/20  Yes [provider]  losartan (COZAAR) 50 MG tablet Take 50 mg by mouth daily.   Yes [provider]  metFORMIN (GLUCOPHAGE) 500 MG tablet Take 500 mg by mouth 2 (two) times daily with a meal.   Yes [provider]    Scheduled Meds: .  stroke: mapping our early stages of recovery book    Does not apply Once  . aspirin EC  81 mg Oral Daily  . atorvastatin  10 mg Oral Daily  . clopidogrel  75 mg Oral Daily  . heparin  5,000 Units Subcutaneous Q8H  . insulin aspart  0-15 Units Subcutaneous TID WC  . insulin aspart  0-5 Units Subcutaneous QHS   Continuous Infusions: PRN Meds:.acetaminophen **OR** acetaminophen (TYLENOL) oral liquid 160 mg/5 mL **OR** acetaminophen, ondansetron (ZOFRAN) IV, senna-docusate     Results for orders placed or performed during the hospital encounter of 12/13/20 (from the past 48 hour(s))  Resp Panel by RT-PCR (Flu A&B, Covid) Nasopharyngeal Swab     Status: None   Collection Time: 12/13/20  7:16 PM   Specimen: Nasopharyngeal Swab; Nasopharyngeal(NP) swabs in vial transport medium  Result Value Ref Range   SARS Coronavirus 2 by RT PCR NEGATIVE NEGATIVE    Comment: (NOTE) SARS-CoV-2 target nucleic acids are NOT DETECTED.  The SARS-CoV-2 RNA is generally detectable in upper respiratory specimens during the acute phase of infection. The lowest concentration of SARS-CoV-2 viral copies this assay can detect is 138 copies/mL. A negative result does not preclude SARS-Cov-2 infection and should not be used as the sole basis for treatment or other patient management decisions. A negative result may occur with  improper specimen collection/handling, submission of specimen other than nasopharyngeal swab, presence of viral mutation(s) within the areas targeted by this assay, and inadequate number of viral copies(<138 copies/mL). A negative result must be combined with clinical observations, patient history, and epidemiological information. The expected result is Negative.  Fact Sheet for Patients:  BloggerCourse.com  Fact Sheet for Healthcare Providers:  SeriousBroker.it  This test is no t yet approved or cleared by the [place] FDA and  has been authorized for detection and/or diagnosis of  SARS-CoV-2 by FDA under an Emergency Use Authorization (EUA). This EUA will remain  in effect (meaning this test can be used) for the duration of the COVID-19 declaration under Section 564(b)(1) of the Act, 21 U.S.C.section 360bbb-3(b)(1), unless the authorization is terminated  or revoked sooner.       Influenza A by PCR NEGATIVE NEGATIVE   Influenza B by PCR NEGATIVE NEGATIVE    Comment: (NOTE) The Xpert Xpress SARS-CoV-2/FLU/RSV plus assay is intended as an aid in the diagnosis of influenza from Nasopharyngeal swab specimens and should not be used as a sole basis for treatment. Nasal washings and aspirates are unacceptable for Xpert Xpress SARS-CoV-2/FLU/RSV testing.  Fact Sheet for Patients: BloggerCourse.com  Fact Sheet for Healthcare Providers: SeriousBroker.it  This test is not yet approved or cleared by the [place] FDA and has been authorized for detection and/or diagnosis of SARS-CoV-2 by FDA under an Emergency Use Authorization (EUA). This EUA will remain in effect (meaning this test can be used) for the duration of the COVID-19 declaration under Section 564(b)(1) of the Act, 21 U.S.C. section 360bbb-3(b)(1), unless the authorization is terminated or revoked.  Performed at [facility], [address], [place], [place] [address]   Ethanol     Status: None  Collection Time: 12/13/20  7:16 PM  Result Value Ref Range   Alcohol, Ethyl (B) <10 <10 mg/dL    Comment: (NOTE) Lowest detectable limit for serum alcohol is 10 mg/dL.  For medical purposes only. Performed at [facility], [address] [place], [place] [address]   Protime-INR     Status: None   Collection Time: 12/13/20  7:16 PM  Result Value Ref Range   Prothrombin Time 12.3 11.4 - 15.2 seconds   INR 0.9 0.8 - 1.2    Comment: (NOTE) INR goal varies based on device and disease states. Performed at [facility], [address], [place], [place]  [address]   APTT     Status: None   Collection Time: 12/13/20  7:16 PM  Result Value Ref Range   aPTT 26 24 - 36 seconds    Comment: Performed at [facility], [address], [place], [place] [address]  CBC     Status: None   Collection Time: 12/13/20  7:16 PM  Result Value Ref Range   WBC 6.0 4.0 - 10.5 K/uL   RBC 4.81 4.22 - 5.81 MIL/uL   Hemoglobin 15.3 13.0 - 17.0 g/dL   HCT 24.8 25.0 - 03.7 %   MCV 91.3 80.0 - 100.0 fL   MCH 31.8 26.0 - 34.0 pg   MCHC 34.9 30.0 - 36.0 g/dL   RDW 04.8 88.9 - 16.9 %   Platelets 155 150 - 400 K/uL   nRBC 0.0 0.0 - 0.2 %    Comment: Performed at [facility], [address], [place], [place] [address]  Differential     Status: None   Collection Time: 12/13/20  7:16 PM  Result Value Ref Range   Neutrophils Relative % 60 %   Neutro Abs 3.6 1.7 - 7.7 K/uL   Lymphocytes Relative 28 %   Lymphs Abs 1.7 0.7 - 4.0 K/uL   Monocytes Relative 9 %   Monocytes Absolute 0.5 0.1 - 1.0 K/uL   Eosinophils Relative 2 %   Eosinophils Absolute 0.1 0.0 - 0.5 K/uL   Basophils Relative 1 %   Basophils Absolute 0.1 0.0 - 0.1 K/uL   Immature Granulocytes 0 %   Abs Immature Granulocytes 0.01 0.00 - 0.07 K/uL    Comment: Performed at [facility], [address], [place], [place] [address]  Comprehensive metabolic panel     Status: Abnormal   Collection Time: 12/13/20  7:16 PM  Result Value Ref Range   Sodium 135 135 - 145 mmol/L   Potassium 4.2 3.5 - 5.1 mmol/L   Chloride 100 98 - 111 mmol/L   CO2 28 22 - 32 mmol/L   Glucose, Bld 227 (H) 70 - 99 mg/dL    Comment: Glucose reference range applies only to samples taken after fasting for at least 8 hours.   BUN 14 6 - 20 mg/dL   Creatinine, Ser 0.34 0.61 - 1.24 mg/dL   Calcium 9.7 8.9 - 91.7 mg/dL   Total Protein 7.4 6.5 - 8.1 g/dL   Albumin 4.6 3.5 - 5.0 g/dL   AST 23 15 - 41 U/L   ALT 26 0 - 44 U/L   Alkaline Phosphatase 49 38 - 126 U/L   Total Bilirubin 0.7 0.3 - 1.2 mg/dL   GFR, Estimated >91 >50 mL/min     Comment: (NOTE) Calculated using the CKD-EPI Creatinine Equation (2021)    Anion gap 7 5 - 15    Comment: Performed at [facility],  [address] [place], [place] [address]  CBG monitoring, ED     Status: Abnormal   Collection Time: 12/13/20  7:26 PM  Result Value Ref Range   Glucose-Capillary 239 (H) 70 - 99 mg/dL    Comment: Glucose reference range applies only to samples taken after fasting for at least 8 hours.  I-stat chem 8, ED     Status: Abnormal   Collection Time: 12/13/20  7:34 PM  Result Value Ref Range   Sodium 134 (L) 135 - 145 mmol/L   Potassium 6.8 (HH) 3.5 - 5.1 mmol/L   Chloride 100 98 - 111 mmol/L   BUN 18 6 - 20 mg/dL   Creatinine, Ser 4.09 0.61 - 1.24 mg/dL   Glucose, Bld 811 (H) 70 - 99 mg/dL    Comment: Glucose reference range applies only to samples taken after fasting for at least 8 hours.   Calcium, Ion 1.16 1.15 - 1.40 mmol/L   TCO2 28 22 - 32 mmol/L   Hemoglobin 15.6 13.0 - 17.0 g/dL   HCT 91.4 78.2 - 95.6 %  I-STAT, chem 8     Status: Abnormal   Collection Time: 12/13/20  7:34 PM  Result Value Ref Range   Sodium 134 (L) 135 - 145 mmol/L   Potassium 6.8 (HH) 3.5 - 5.1 mmol/L    Comment: SPECIMEN HEMOLYZED. HEMOLYSIS MAY AFFECT INTEGRITY OF RESULTS. SEE LABS Performed at [facility], [address] [place], [place] [address]    Chloride 100 98 - 111 mmol/L   BUN 18 6 - 20 mg/dL   Creatinine, Ser 6.57 0.61 - 1.24 mg/dL   Glucose, Bld 846 (H) 70 - 99 mg/dL    Comment: Glucose reference range applies only to samples taken after fasting for at least 8 hours.   Calcium, Ion 1.16 1.15 - 1.40 mmol/L   TCO2 28 22 - 32 mmol/L   Hemoglobin 15.6 13.0 - 17.0 g/dL   HCT 96.2 95.2 - 84.1 %  Glucose, capillary     Status: Abnormal   Collection Time: 12/13/20 11:11 PM  Result Value Ref Range   Glucose-Capillary 142 (H) 70 - 99 mg/dL    Comment: Glucose reference range applies only to samples taken after fasting for at least 8 hours.  HIV Antibody (routine  testing w rflx)     Status: None   Collection Time: 12/14/20  5:38 AM  Result Value Ref Range   HIV Screen 4th Generation wRfx Non Reactive Non Reactive    Comment: Performed at [facility], [address], [place], [place] [address]  Lipid panel     Status: None   Collection Time: 12/14/20  5:38 AM  Result Value Ref Range   Cholesterol 162 0 - 200 mg/dL   Triglycerides 102 <725 mg/dL   HDL 74 >36 mg/dL   Total CHOL/HDL Ratio 2.2 RATIO   VLDL 24 0 - 40 mg/dL   LDL Cholesterol 64 0 - 99 mg/dL    Comment:        Total Cholesterol/HDL:CHD Risk Coronary Heart Disease Risk Table                     Men   Women  1/2 Average Risk   3.4   3.3  Average Risk       5.0   4.4  2 X Average Risk   9.6   7.1  3 X Average Risk  23.4   11.0  Use the calculated Patient Ratio above and the CHD Risk Table to determine the patient's CHD Risk.        ATP III CLASSIFICATION (LDL):  <100     mg/dL   Optimal  409-811  mg/dL   Near or Above                    Optimal  130-159  mg/dL   Borderline  914-782  mg/dL   High  >956     mg/dL   Very High Performed at [facility], [address], [place], [place] [address]   CBC     Status: Abnormal   Collection Time: 12/14/20  5:38 AM  Result Value Ref Range   WBC 4.9 4.0 - 10.5 K/uL   RBC 4.77 4.22 - 5.81 MIL/uL   Hemoglobin 15.2 13.0 - 17.0 g/dL   HCT 65.7 84.6 - 96.2 %   MCV 93.3 80.0 - 100.0 fL   MCH 31.9 26.0 - 34.0 pg   MCHC 34.2 30.0 - 36.0 g/dL   RDW 95.2 84.1 - 32.4 %   Platelets 129 (L) 150 - 400 K/uL   nRBC 0.0 0.0 - 0.2 %    Comment: Performed at [facility], [address], [place], [place] [address]  Comprehensive metabolic panel     Status: Abnormal   Collection Time: 12/14/20  5:38 AM  Result Value Ref Range   Sodium 138 135 - 145 mmol/L   Potassium 4.4 3.5 - 5.1 mmol/L    Comment: DELTA CHECK NOTED   Chloride 102 98 - 111 mmol/L   CO2 28 22 - 32 mmol/L   Glucose, Bld 140 (H) 70 - 99 mg/dL    Comment: Glucose  reference range applies only to samples taken after fasting for at least 8 hours.   BUN 13 6 - 20 mg/dL   Creatinine, Ser 7.25 0.61 - 1.24 mg/dL   Calcium 9.6 8.9 - 36.6 mg/dL   Total Protein 7.2 6.5 - 8.1 g/dL   Albumin 4.3 3.5 - 5.0 g/dL   AST 21 15 - 41 U/L   ALT 27 0 - 44 U/L   Alkaline Phosphatase 42 38 - 126 U/L   Total Bilirubin 1.1 0.3 - 1.2 mg/dL   GFR, Estimated >44 >03 mL/min    Comment: (NOTE) Calculated using the CKD-EPI Creatinine Equation (2021)    Anion gap 8 5 - 15    Comment: Performed at [facility], [address] [place], [place] [address]  Urine rapid drug screen (hosp performed)     Status: None   Collection Time: 12/14/20  6:15 AM  Result Value Ref Range   Opiates NONE DETECTED NONE DETECTED   Cocaine NONE DETECTED NONE DETECTED   Benzodiazepines NONE DETECTED NONE DETECTED   Amphetamines NONE DETECTED NONE DETECTED   Tetrahydrocannabinol NONE DETECTED NONE DETECTED   Barbiturates NONE DETECTED NONE DETECTED    Comment: (NOTE) DRUG SCREEN FOR MEDICAL PURPOSES ONLY.  IF CONFIRMATION IS NEEDED FOR ANY PURPOSE, NOTIFY LAB WITHIN 5 DAYS.  LOWEST DETECTABLE LIMITS FOR URINE DRUG SCREEN Drug Class                     Cutoff (ng/mL) Amphetamine and metabolites    1000 Barbiturate and metabolites    200 Benzodiazepine                 200 Tricyclics and metabolites     300 Opiates and metabolites  300 Cocaine and metabolites        300 THC                            50 Performed at [facility], [address], [place], [place] [address]   Urinalysis, Routine w reflex microscopic Urine, Clean Catch     Status: Abnormal   Collection Time: 12/14/20  6:15 AM  Result Value Ref Range   Color, Urine YELLOW YELLOW   APPearance CLEAR CLEAR   Specific Gravity, Urine 1.041 (H) 1.005 - 1.030   pH 6.0 5.0 - 8.0   Glucose, UA NEGATIVE NEGATIVE mg/dL   Hgb urine dipstick NEGATIVE NEGATIVE   Bilirubin Urine NEGATIVE NEGATIVE   Ketones, ur NEGATIVE NEGATIVE  mg/dL   Protein, ur NEGATIVE NEGATIVE mg/dL   Nitrite NEGATIVE NEGATIVE   Leukocytes,Ua NEGATIVE NEGATIVE    Comment: Performed at [facility], [address] [place], [place] [address]  Glucose, capillary     Status: Abnormal   Collection Time: 12/14/20  7:19 AM  Result Value Ref Range   Glucose-Capillary 148 (H) 70 - 99 mg/dL    Comment: Glucose reference range applies only to samples taken after fasting for at least 8 hours.  Glucose, capillary     Status: Abnormal   Collection Time: 12/14/20 10:58 AM  Result Value Ref Range   Glucose-Capillary 120 (H) 70 - 99 mg/dL    Comment: Glucose reference range applies only to samples taken after fasting for at least 8 hours.  Glucose, capillary     Status: Abnormal   Collection Time: 12/14/20  4:11 PM  Result Value Ref Range   Glucose-Capillary 120 (H) 70 - 99 mg/dL    Comment: Glucose reference range applies only to samples taken after fasting for at least 8 hours.    Studies/Results: TTE 1. Left ventricular ejection fraction, by estimation, is 60 to 65%. The  left ventricle has normal function. The left ventricle has no regional  wall motion abnormalities. Left ventricular diastolic parameters were  normal.  2. Right ventricular systolic function is low normal. The right  ventricular size is normal. Tricuspid regurgitation signal is inadequate  for assessing PA pressure.  3. The mitral valve is grossly normal. Mild mitral valve regurgitation.  4. The aortic valve is tricuspid. Aortic valve regurgitation is not  visualized.  5. The inferior vena cava is normal in size with greater than 50%  respiratory variability, suggesting right atrial pressure of 3 mmHg.     HEAD CT IMPRESSION: 1. Normal head CT. 2. ASPECTS is 10.     CTA HEAD NACK FINDINGS: CTA NECK FINDINGS  Aortic arch: Branching pattern of the brachiocephalic vessels from the arch is normal. No origin stenosis.  Right carotid system: Common carotid  artery widely patent to the bifurcation. Carotid bifurcation shows mild soft plaque but no stenosis or irregularity. Cervical ICA widely patent.  Left carotid system: Common carotid artery widely patent to the bifurcation. Soft and calcified plaque at the carotid bifurcation and ICA bulb with some irregularity. No stenosis. Cervical ICA widely patent.  Vertebral arteries: Both vertebral artery origins are widely patent. Both vertebral arteries appear normal through the cervical region to the foramen magnum.  Skeleton: Mid cervical spondylosis.  Other neck: No soft tissue mass or lymphadenopathy.  Upper chest: Normal  Review of the MIP images confirms the above findings  CTA HEAD FINDINGS  Anterior circulation: Both internal carotid arteries widely  patent through the skull base and siphon regions. The anterior and middle cerebral vessels are patent. No large or medium vessel occlusion, proximal stenosis, aneurysm or vascular malformation.  Posterior circulation: Both vertebral arteries widely patent to the basilar. No basilar stenosis. Posterior circulation branch vessels are normal.  Venous sinuses: Patent and normal.  Anatomic variants: None significant.  Review of the MIP images confirms the above findings  IMPRESSION: No intracranial large or medium vessel occlusion.  Atherosclerotic plaque at both carotid bifurcations but without stenosis. Mild irregularity on the left.    BRAIN MRI FINDINGS: Brain: Small acute infarct in the right pons (series 5 and 6, images 11/12). Slight edema without mass effect. No acute hemorrhage. No hydrocephalus, extra-axial fluid collection, mass lesion or mass effect. Mild for age scattered T2/FLAIR hyperintensities, nonspecific but most likely related to chronic microvascular ischemic disease. Bilateral inferior basal ganglia dilated perivascular spaces.  Vascular: Major arterial flow voids are maintained at the  skull base.  Skull and upper cervical spine: Normal marrow signal.  Sinuses/Orbits: Mild ethmoid air cell mucosal thickening and small left maxillary sinus retention cyst. Otherwise, clear visualized sinuses. Unremarkable orbits.  Other: No mastoid effusions.  IMPRESSION: Small acute infarct in the right pons. Slight edema without mass Effect.      THE BRAIN MRI IS REVIEWED IN PERSON. There is a faint increased signal involving the right pontine take mental area. There is moderate global atrophy for age. There is minimal white matter changes and no encephalomalacia is noted. Additionally, no hemorrhages noted.    Nussen Pullin A. Alexander Pilgrimoonquah, M.D.  Diplomate, Biomedical engineerAmerican Board of Psychiatry and Neurology ( Neurology). 12/14/2020, 5:21 PM

## 2020-12-14 NOTE — Evaluation (Signed)
Physical Therapy Evaluation Patient Details Name: Alexander Warner MRN: [id number] DOB: Jul 16, 1961 Today's Date: 12/14/2020   History of Present Illness  Alexander Warner  is a 60 y.o. male, with history of high cholesterol, diabetes mellitus type 2, hypertension, who is still very active, comes in today with a left upper and lower extremity weakness.  Patient reports that really after last he was at work when he began to not feel well.  He felt like his body was heavy.  He then noticed that he was having difficulty with proprioception of his left hand, weakness of his left hand and weakness of his left leg.  He denies any foot drop.  He said he rested for a moment at work and then felt better but not 100%.  He reports that his symptoms were constant.  He describes his leg weakness as his legs feeling like a "removal."  He had no paresthesias.  Does report a family history of stroke.  Patient has a history of high blood pressure and reports he has been taking his blood pressure medications as prescribed.  His blood pressure has been running slightly higher than his normal at 150s over high 90s.  Patient reports he recently had a cold and was taking a over-the-counter decongestant.  He did not take any of his decongestant today.  Patient reports he has never had symptoms like this before.    Clinical Impression  Patient functioning at baseline for functional mobility and gait demonstrating good return for ambulation on level, inclined, declined surfaces and going up/down stairs without loss of balance.  Plan:  Patient discharged from physical therapy to care of nursing for ambulation daily as tolerated for length of stay.     Follow Up Recommendations No PT follow up    Equipment Recommendations  None recommended by PT    Recommendations for Other Services       Precautions / Restrictions Precautions Precautions: None Restrictions Weight Bearing Restrictions: No      Mobility  Bed Mobility Overal  bed mobility: Modified Independent                  Transfers Overall transfer level: Independent               General transfer comment: Independent sit to stand, ambulation, and return to EOB.  Ambulation/Gait Ambulation/Gait assistance: Modified independent (Device/Increase time);Independent Gait Distance (Feet): 200 Feet Assistive device: None Gait Pattern/deviations: WFL(Within Functional Limits) Gait velocity: near normal   General Gait Details: demonstrates good return for ambulation on level, inclined and declined surfaces without loss of balance  Stairs Stairs: Yes Stairs assistance: Modified independent (Device/Increase time) Stair Management: Alternating pattern Number of Stairs: 10 General stair comments: demonstrates good return for going up/down stairs without use of side rails, no loss of balance  Wheelchair Mobility    Modified Rankin (Stroke Patients Only)       Balance Overall balance assessment: Independent                                           Pertinent Vitals/Pain Pain Assessment: No/denies pain    Home Living Family/patient expects to be discharged to:: Private residence Living Arrangements: Alone Available Help at Discharge: Family;Available PRN/intermittently Type of Home: Mobile home Home Access: Stairs to enter Entrance Stairs-Rails: Left Entrance Stairs-Number of Steps: 2 Home Layout: One level Home Equipment: None  Prior Function Level of Independence: Independent         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 Decreased light touch from elbow to hand on L UE. LUE Sensation: decreased light touch LUE Coordination: WNL    Lower Extremity Assessment Lower Extremity  Assessment: Overall WFL for tasks assessed    Cervical / Trunk Assessment Cervical / Trunk Assessment: Normal  Communication   Communication: No difficulties  Cognition Arousal/Alertness: Awake/alert Behavior During Therapy: WFL for tasks assessed/performed Overall Cognitive Status: Within Functional Limits for tasks assessed                                        General Comments      Exercises     Assessment/Plan    PT Assessment Patent does not need any further PT services  PT Problem List         PT Treatment Interventions      PT Goals (Current goals can be found in the Care Plan section)  Acute Rehab PT Goals Patient Stated Goal: return home PT Goal Formulation: With patient Time For Goal Achievement: 12/14/20 Potential to Achieve Goals: Good    Frequency     Barriers to discharge        Co-evaluation PT/OT/SLP Co-Evaluation/Treatment: Yes Reason for Co-Treatment: To address functional/ADL transfers PT goals addressed during session: Mobility/safety with mobility;Balance OT goals addressed during session: ADL's and self-care;Strengthening/ROM       AM-PAC PT "6 Clicks" Mobility  Outcome Measure Help needed turning from your back to your side while in a flat bed without using bedrails?: None Help needed moving from lying on your back to sitting on the side of a flat bed without using bedrails?: None Help needed moving to and from a bed to a chair (including a wheelchair)?: None Help needed standing up from a chair using your arms (e.g., wheelchair or bedside chair)?: None Help needed to walk in hospital room?: None Help needed climbing 3-5 steps with a railing? : None 6 Click Score: 24    End of Session   Activity Tolerance: Patient tolerated treatment well Patient left: in bed;with call bell/phone within reach Nurse Communication: Mobility status PT Visit Diagnosis: Unsteadiness on feet (R26.81);Other abnormalities of gait and  mobility (R26.89);Muscle weakness (generalized) (M62.81)    Time: 4166-0630 PT Time Calculation (min) (ACUTE ONLY): 20 min   Charges:   PT Evaluation $PT Eval Moderate Complexity: 1 Mod PT Treatments $Therapeutic Activity: 8-22 mins        11:44 AM, 12/14/20 Alexander Warner, MPT Physical Therapist with [facility] [phone] office [phone] mobile phone

## 2020-12-15 DIAGNOSIS — E119 Type 2 diabetes mellitus without complications: Secondary | ICD-10-CM | POA: Diagnosis not present

## 2020-12-15 DIAGNOSIS — I10 Essential (primary) hypertension: Secondary | ICD-10-CM | POA: Diagnosis not present

## 2020-12-15 DIAGNOSIS — E785 Hyperlipidemia, unspecified: Secondary | ICD-10-CM | POA: Diagnosis not present

## 2020-12-15 LAB — GLUCOSE, CAPILLARY
Glucose-Capillary: 139 mg/dL — ABNORMAL HIGH (ref 70–99)
Glucose-Capillary: 158 mg/dL — ABNORMAL HIGH (ref 70–99)

## 2020-12-15 MED ORDER — CLOPIDOGREL BISULFATE 75 MG PO TABS: 75.0000 mg | ORAL_TABLET | Freq: Every day | ORAL | 0 refills | Status: AC

## 2020-12-15 MED ORDER — ASPIRIN EC 81 MG PO TBEC: 81.0000 mg | DELAYED_RELEASE_TABLET | Freq: Every day | ORAL | 11 refills | Status: AC

## 2020-12-15 NOTE — Progress Notes (Signed)
Nsg Discharge Note  Admit Date:  12/13/2020 Discharge date: 12/15/2020   Alexander Warner to be D/C'd Home per MD order.  AVS completed.  Copy for chart, and copy for patient signed, and dated. Patient/caregiver able to verbalize understanding.  Discharge Medication: Allergies as of 12/15/2020      Reactions   Glimepiride Other (See Comments)   Pt states he didn't like the way it made him feel      Medication List    TAKE these medications   aspirin EC 81 MG tablet Take 1 tablet (81 mg total) by mouth daily. What changed:   when to take this  reasons to take this   atorvastatin 10 MG tablet Commonly known as: LIPITOR Take 10 mg by mouth daily.   clopidogrel 75 MG tablet Commonly known as: PLAVIX Take 1 tablet (75 mg total) by mouth daily.   hydrochlorothiazide 25 MG tablet Commonly known as: HYDRODIURIL Take 1 tablet (25 mg total) by mouth daily.   losartan 100 MG tablet Commonly known as: COZAAR Take 1 tablet by mouth daily. What changed: Another medication with the same name was removed. Continue taking this medication, and follow the directions you see here.   metFORMIN 500 MG tablet Commonly known as: GLUCOPHAGE Take 500 mg by mouth 2 (two) times daily with a meal.       Discharge Assessment: Vitals:   12/15/20 0055 12/15/20 0509  BP: (!) 141/92 125/90  Pulse: (!) 59 (!) 57  Resp: 18   Temp: 98.1 F (36.7 C) 98.1 F (36.7 C)  SpO2: 98% 98%   Skin clean, dry and intact without evidence of skin break down, no evidence of skin tears noted. IV catheter discontinued intact. Site without signs and symptoms of complications - no redness or edema noted at insertion site, patient denies c/o pain - only slight tenderness at site.  Dressing with slight pressure applied.  D/c Instructions-Education: Discharge instructions given to patient/family with verbalized understanding. D/c education completed with patient/family including follow up instructions, medication list,  d/c activities limitations if indicated, with other d/c instructions as indicated by MD - patient able to verbalize understanding, all questions fully answered. Patient instructed to return to ED, call 911, or call MD for any changes in condition.  Patient escorted via WC, and D/C home via private auto.  Brandy Hale, lpn 01/26/2375 2:83 PM

## 2020-12-15 NOTE — Discharge Instructions (Signed)
Take aspirin and plavix together for 1 month only and then take aspirin 81 mg daily afterwards.   Please follow up with Dr. Gerilyn Pilgrim for neurology follow up in 1 month.    IMPORTANT INFORMATION: PAY CLOSE ATTENTION   PHYSICIAN DISCHARGE INSTRUCTIONS  Follow with Primary care provider  Sigmund Hazel, MD  and other consultants as instructed by your Hospitalist Physician  SEEK MEDICAL CARE OR RETURN TO EMERGENCY ROOM IF SYMPTOMS COME BACK, WORSEN OR NEW PROBLEM DEVELOPS   Please note: You were cared for by a hospitalist during your hospital stay. Every effort will be made to forward records to your primary care provider.  You can request that your primary care provider send for your hospital records if they have not received them.  Once you are discharged, your primary care physician will handle any further medical issues. Please note that NO REFILLS for any discharge medications will be authorized once you are discharged, as it is imperative that you return to your primary care physician (or establish a relationship with a primary care physician if you do not have one) for your post hospital discharge needs so that they can reassess your need for medications and monitor your lab values.  Please get a complete blood count and chemistry panel checked by your Primary MD at your next visit, and again as instructed by your Primary MD.  Get Medicines reviewed and adjusted: Please take all your medications with you for your next visit with your Primary MD  Laboratory/radiological data: Please request your Primary MD to go over all hospital tests and procedure/radiological results at the follow up, please ask your primary care provider to get all Hospital records sent to his/her office.  In some cases, they will be blood work, cultures and biopsy results pending at the time of your discharge. Please request that your primary care provider follow up on these results.  If you are diabetic, please bring  your blood sugar readings with you to your follow up appointment with primary care.    Please call and make your follow up appointments as soon as possible.    Also Note the following: If you experience worsening of your admission symptoms, develop shortness of breath, life threatening emergency, suicidal or homicidal thoughts you must seek medical attention immediately by calling 911 or calling your MD immediately  if symptoms less severe.  You must read complete instructions/literature along with all the possible adverse reactions/side effects for all the Medicines you take and that have been prescribed to you. Take any new Medicines after you have completely understood and accpet all the possible adverse reactions/side effects.   Do not drive when taking Pain medications or sleeping medications (Benzodiazepines)  Do not take more than prescribed Pain, Sleep and Anxiety Medications. It is not advisable to combine anxiety,sleep and pain medications without talking with your primary care practitioner  Special Instructions: If you have smoked or chewed Tobacco  in the last 2 yrs please stop smoking, stop any regular Alcohol  and or any Recreational drug use.  Wear Seat belts while driving.  Do not drive if taking any narcotic, mind altering or controlled substances or recreational drugs or alcohol.        Hospital Discharge After a Stroke  Being discharged from the hospital after a stroke can feel overwhelming. Many things may be different. It is normal to feel scared or anxious. Some stroke survivors may be able to return to their homes. Others may need more specialized  care on a temporary or permanent basis. Your stroke care team will work with you to develop a discharge plan that is best for you. Ask questions if you do not understand something. Invite a friend or family member to participate in discharge planning. It is important to understand and follow your discharge plan to help prevent  another stroke or other problems. General recommendations  Ask a friend or family member to get needed things in place before you go home if possible. A therapist can come to your home to make recommendations for safety equipment. Ask your health care provider if you would benefit from this service or from home care.  Take steps to prevent falls, such as: ? Installing grab bars in the shower or using a shower chair. ? Install grab bars by the toilet. ? Removing tripping hazards, such as area rugs or cords.   Supplies needed Ask your health care provider for a list of medical equipment and supplies you will need at home. These may include:  Walkers.  Canes.  Wheelchairs.  Hand-strengthening devices.  Special eating utensils. Medical equipment can be rented or purchased, depending on your insurance coverage. Check with your insurance company about what is covered. Follow these instructions at home: Medicines  Take all medicines exactly as told by your health care provider to prevent serious harm, such as another stroke. Make sure you understand: ? What medicine to take. ? Why you are taking the medicine. ? How and when to take it. ? If it can be taken with your other medicines and herbal supplements. ? Possible side effects, and when to call your health care provider if you have side effects. ? How you will get and pay for your medicines. Medical assistance programs may be able to help you pay for prescription medicines if you cannot afford them.  If you are taking an anticoagulant: ? Be sure to take it exactly as told by your health care provider. This medicine can increase the risk of bleeding because it works to prevent blood clots. You may need to take certain precautions to prevent bleeding. ? Do not take medicines that contain aspirin or NSAIDs, such as ibuprofen, unless your health care provider approves. These medicines increase your risk for dangerous bleeding. Preventing  another stroke Having a stroke puts you at risk for another stroke in the future. Ask your health care provider what actions you can take to lower the risk. These may include:  Increasing how much you exercise.  Making a healthy eating plan.  Quitting smoking.  Managing other health conditions, such as high blood pressure, high cholesterol, or diabetes.  Limiting alcohol use. General instructions  Before you leave the hospital, you will be given information about stroke warning signs. Share these with your friends and family members.  You will need to follow up regularly with a health care provider. You may need rehabilitation, such as physical therapy, occupational therapy, and speech-language therapy. Keeping these appointments is very important to your recovery.  Be sure to bring your medicine list and discharge papers to your appointments. Use a calendar or appointment reminder if you need help to keep track of your schedule. Contact a health care provider if:  You are taking an anticoagulant, and you have one or more of these problems: ? Bleeding or bruising. ? A fall or other injury to your head. ? Blood in your urine or stool (feces). Get help right away if you:  Have any symptoms of a  stroke. "BE FAST" is an easy way to remember the main warning signs of a stroke: ? B - Balance. Signs are dizziness, sudden trouble walking, or loss of balance. ? E - Eyes. Signs are trouble seeing or a sudden change in vision. ? F - Face. Signs are sudden weakness or numbness of the face, or the face or eyelid drooping on one side. ? A - Arms. Signs are weakness or numbness in an arm. This happens suddenly and usually on one side of the body. ? S - Speech. Signs are sudden trouble speaking, slurred speech, or trouble understanding what people say. ? T - Time. Time to call emergency services. Write down what time symptoms started. Your emergency responders will need to know this  information.  Have other signs of a stroke, such as: ? A sudden, severe headache with no known cause. ? Nausea or vomiting. ? Seizure. These symptoms may represent a serious problem that is an emergency. Do not wait to see if the symptoms will go away. Get medical help right away. Call your local emergency services (911 in the [place] Do not drive yourself to the hospital.   Summary  Being discharged from the hospital after a stroke can feel overwhelming. It is normal to feel scared or anxious.  Make sure you take medicines exactly as told by your health care provider.  Know the warning signs of a stroke. Before you leave the hospital, you will be given information about stroke warning signs. Share these with your friends and family members.  Get help right way if you have any symptoms of a stroke. "BE FAST" is an easy way to remember the main warning signs of a stroke. This information is not intended to replace advice given to you by your health care provider. Make sure you discuss any questions you have with your health care provider. Document Revised: 02/24/2020 Document Reviewed: 02/24/2020 Elsevier Patient Education  2021 [employer] Inc.   Eating Plan After Stroke A stroke causes damage to the brain cells, which can affect your ability to walk, talk, and even eat. The impact of a stroke is different for everyone, and so is recovery. A good nutrition plan is important for your recovery. It can also lower your risk of another stroke. If you have difficulty chewing and swallowing your food, a dietitian or your stroke care team can help so that you can enjoy eating healthy foods. What are tips for following this plan? Reading food labels  Choose foods that have less than 300 milligrams (mg) of sodium per serving. Limit your sodium intake to less than 1,500 mg per day.  Avoid foods that have saturated fat and trans fat.  Choose foods that are low in cholesterol. Limit the amount of  cholesterol you eat each day to less than 200 mg.  Choose foods that are high in fiber. Eat 20-30 grams (g) of fiber each day.  Avoid foods with added sugar. Check the food label for ingredients such as sugar, corn syrup, honey, fructose, molasses, and cane juice. Shopping  At the grocery store, buy most of your food from areas near the walls of the store. This includes: ? Fresh fruits and vegetables. ? Dry grains, beans, nuts, and seeds. ? Fresh seafood, poultry, lean meats, and eggs. ? Low-fat dairy products.  Buy whole ingredients instead of prepackaged foods.  Buy fresh, in-season fruits and vegetables from local farmers markets.  Buy frozen fruits and vegetables in resealable bags. Cooking  Prepare  foods with very little salt. Use herbs or salt-free spices instead.  Cook with heart-healthy oils, such as olive, avocado, canola, soybean, or sunflower oil.  Avoid frying foods. Bake, grill, or broil foods instead.  Remove visible fat and skin from meat and poultry before eating.  Modify food textures as told by your health care provider. Meal planning  Eat a wide variety of colorful fruits and vegetables. Make sure one-half of your plate is filled with fruits and vegetables at each meal.  Eat fruits and vegetables that are high in potassium, such as: ? Apples, bananas, oranges, and melon. ? Sweet potatoes, spinach, zucchini, and tomatoes.  Eat fish that contain heart-healthy fats (omega-3 fats) at least twice a week. These include salmon, tuna, mackerel, and sardines.  Eat plant foods that are high in omega-3 fats, such as flaxseeds and walnuts. Add these to cereals, yogurt, or pasta dishes.  Eat several servings of high-fiber foods each day, such as fruits, vegetables, whole grains, and beans.  Do not put salt at the table for meals.  When eating out at restaurants: ? Ask the server about low-salt or salt-free food options. ? Avoid fried foods. Look for menu items  that are grilled, steamed, broiled, or roasted. ? Ask if your food can be prepared without butter. ? Ask for condiments, such as salad dressings, gravy, or sauces to be served on the side.  If you have difficulty swallowing: ? Choose foods that are softer and easier to chew and swallow. ? Cut foods into small pieces and chew well before swallowing. ? Thicken liquids as told by your health care provider or dietitian. ? Let your health care provider know if your condition does not improve over time. You may need to work with a speech therapist to re-train the muscles that are used for eating. General recommendations  Involve your family and friends in your recovery, if possible. It may be helpful to have a slower meal time and to plan meals that include foods everyone in the family can eat.  Brush your teeth with fluoride toothpaste twice a day, and floss once a day. Keeping a clean mouth can help you swallow and can also help your appetite.  Drink enough water each day to keep your urine pale yellow. If needed, set reminders or ask your family to help you remember to drink water.  Limit alcohol intake to no more than 1 drink a day for nonpregnant women and 2 drinks a day for men. One drink equals 12 oz of beer, 5 oz of wine, or 1 oz of hard liquor.   Summary  Following this eating plan can help in your stroke recovery and can decrease your risk for another stroke.  Let your health care provider know if you have problems with swallowing. You may need to work with a speech therapist. This information is not intended to replace advice given to you by your health care provider. Make sure you discuss any questions you have with your health care provider. Document Revised: 10/30/2018 Document Reviewed: 09/16/2017 Elsevier Patient Education  2021 [employer].   Driving After a Stroke Driving can be dangerous after a stroke because a stroke can cause physical, emotional, cognitive, and  behavioral changes. Damage to your brain and other parts of your nervous system may affect your ability to drive. You may have weakness, stiffness, and pain, and have problems moving, talking, seeing, touching, or problem-solving. A stroke can also cause inability to move (paralysis) on one  side of your body. Can I return to driving? Ask your health care provider when it is safe for you to drive. Laws on driving after a stroke vary by state. Your health care provider may recommend that you:  Get a driving evaluation to have your vision, thinking, reaction time, and driving skills tested.  Take a driving rehabilitation program for people who have had a stroke.  Take a driving class or a retraining program.   How is driving affected by a stroke? A family member may be the first to notice that it is not safe for you to drive. You may have problems with:  Your vision.  Talking and communicating.  Weakness, pain, and stiffness in your arms or legs.  Responding to changes on the road.  Using the steering wheel, pedals, and other parts of the car.  Thinking while driving.  Judgment on the road. What are some signs that it may not be safe for me to drive? Signs that driving may be unsafe for you include:  Driving too fast or too slowly.  Needing help from others while driving.  Not paying attention to street signs or signals.  Making bad decisions while driving.  Not keeping enough distance between cars.  Drifting into other lanes.  Becoming confused, angry, or frustrated.  Getting lost in familiar places.  Having accidents while driving. What is adaptive equipment? Adaptive equipment refers to devices that can help people who have had a stroke to drive and do other activities. You may need:  A wheelchair-accessible car.  Special hand controls in the car.  Pedal extensions for the car.  A seat base to help you stay positioned in your seat.  Lifts and ramps to help you  get in and out of the car. Summary  Damage to your brain and other parts of your nervous system may affect your ability to drive.  Supporting Someone After a Stroke Caregivers provide essential physical and emotional support to people who have had a stroke. If you are supporting someone who has had a stroke, you play an important role in coordinating schedules, helping your loved one to communicate, and helping with the overall rehabilitation plan. What do I need to know about my loved one's recovery? Recovering from a stroke can take weeks, months, or years. Some people may be able to return to a normal lifestyle, and other people may have permanent problems with movement (mobility), thinking, behavior, or communication. Understanding your loved one's condition can help you manage the role of caregiver. Your loved one's health care team may rely on you to provide information such as medical history and current medicines. How can I support my friend or family member? Planning for discharge from the hospital Ask to meet with a social worker or a stroke care coordinator, if one is available. This person can help you to plan for discharge. Before you leave the hospital, make sure you understand: The recovery process after a stroke. Physical, emotional, behavioral, and other changes that may affect your loved one after a stroke. The treatments for stroke, including the medicines that your loved one has to take. How to lower the risk of another stroke. Diet and exercise changes for your loved one. Whether you will need extra help at home. Whether your loved one will need help using the bathroom, bathing, eating, or doing other activities. Changes you have to make at home to make it safe for your loved one. Whether you need to get special  devices or equipment for your loved one. General help Help your loved one establish a daily routine. This may include setting reminders or having a shared day  planner or calendar. Encourage rest. Your loved one may need frequent breaks during social situations or other activities. Be patient. Your loved one may take longer to complete tasks and to process information. When giving instructions, give only one instruction at a time or give step-by-step lists. Multitasking can be difficult after a stroke. Offer assistance with household chores or other daily tasks. Make "freezer meals" that can be reheated. Do not set expectations about your loved one's recovery. Medical visits Gently remind your loved one of tasks and medical visits if he or she is forgetful. Provide transportation to and from appointments. Attend rehabilitation appointments with your loved one. By being involved in the rehabilitation plan, you can encourage your loved one and help with exercises and therapy activities at home. Preventing falls Follow instructions to prevent falls in your loved one's home. These may include: Installing grab bars in bathrooms and handrails in stairways. Using night-lights in the bedroom, bathroom, or hallways. Removing rugs and mats or making them stick to the floor. Keeping walkways clear by removing cords and clutter from the floor.   Managing finances Help to manage your loved one's finances. Talk with a Child psychotherapist or legal professional if: Your loved one is unable to return to work and is in need of financial help. You need help paying medical bills. You need to establish guardianship over finances. You need help with estate planning. How should I care for myself? Helping a loved one recover from a stroke can be rewarding, and it can also be challenging and stressful at times. Make sure to care for your own well-being during this time. Lifestyle Rest. Try to get 7-9 hours of uninterrupted sleep each night. Eat a balanced diet that includes fresh fruits and vegetables, whole grains, lean proteins, and low-fat dairy. Exercise for 30 or more  minutes on 5 or more days each week. Find ways to manage stress. These may include: Deep breathing, yoga, or meditation. Spending time outdoors. Journaling. Finding support Ask for help. Take a break if you are the primary caregiver to your loved one. Spend time with supportive people. Join a support group with other caregivers or family members of people who have had a stroke. If you experience new or worsening depression or anxiety, seek counseling from a mental health professional.   Where to find more information You may find more information about supporting someone who has had a stroke from: National Stroke Association: [employer] American Stroke Association: www.strokeassociation.org/STROKEORG Summary Caregivers provide essential physical and emotional support to people who have had a stroke. Before you leave the hospital, make sure you understand how to care for someone who has had a stroke. It is normal to have many different emotions while caring for someone who has had a stroke. Make sure to care for your own well-being during this time. This information is not intended to replace advice given to you by your health care provider. Make sure you discuss any questions you have with your health care provider. Document Revised: 10/30/2018 Document Reviewed: 10/26/2016 Elsevier Patient Education  2021 Elsevier Inc.    Ask your health care provider when it is safe for you to drive again. You may need to take steps such as getting a driving evaluation or taking a driving class.  A family member may be the first to notice  that it is not safe for you to drive.  You may need adaptive equipment to drive safely. This information is not intended to replace advice given to you by your health care provider. Make sure you discuss any questions you have with your health care provider. Document Revised: 06/21/2017 Document Reviewed: 10/15/2016 Elsevier  Patient Education  2021 [employer].    Speech-Language Therapy After a Stroke You may have many physical changes after a stroke, and some of them may affect your ability to communicate. You may have problems talking, putting your thoughts into words, or using and understanding words. Speech-language therapy is a common treatment after a stroke. How are speech and language affected by a stroke? A stroke can damage your brain and nervous system. In most people, the left side of the brain controls language and speech, so damage to that area can affect those functions. You may have problems with:  Understanding spoken or written words.  Sharing your thoughts by talking. You may have trouble: ? Speaking clearly. ? Saying what you mean to say.   What is aphasia? Aphasia is a condition that affects your ability to communicate with others. A stroke often causes aphasia because of damage to the left side of the brain. This is typically the side that controls the ability to talk and understand language. Symptoms of aphasia include:  Struggling to think of words and names of people, places, and objects.  Using the wrong words while talking.  Making up new words (without knowing it) that do not make sense to other people.  Problems putting words together into a sentence.  Difficulty understanding others while they talk.  Problems with listening, reading, writing, using numbers, or doing math. How can therapy help? Speech-language therapy is a common treatment during stroke recovery. It may include doing communication activities, exercising your speech muscles, and practicing speech. It may help you:  Learn to talk and communicate again.  Have conversations.  Use the right words to express ideas.  Put words together into sentences.  Learn to speak more clearly so others can understand you.  Use gestures, sign language, symbols, or other methods to express yourself (aided  communication). These can be used in place of speech or to add to what you are able to say. Some aided communication may involve use of electronic devices. When will therapy start and where will I have therapy? Your health care provider will decide when it is best for you to start therapy. Some people start rehabilitation, including speech-language therapy, as soon as they are medically stable. This may be 24-48 hours after a stroke. Rehabilitation can take place in a few different places, based on your needs. It may take place in:  The hospital or an in-patient rehabilitation hospital.  An outpatient rehabilitation facility.  A long-term care facility.  A community rehabilitation clinic.  Your home. How can my family and friends support my recovery? Your family and friends can help by:  Being open about your problems.  Creating or modifying daily routines to make talking and communicating easier.  Lowering background noise.  Being patient when you are trying to express your thoughts or understand other people.  Getting your attention before talking to you.  Using short and simple sentences and giving you extra time to talk or to respond to questions.  Talking to you in a normal voice.  Keeping eye contact with you during conversation.  Paying attention to your body language.  Using pictures, written words,  or symbols to help you understand.  Helping you to use aided communication.  Asking "yes" or "no" questions.  Praising your speech and progress. Summary  Since a stroke results from damage to your brain and nervous system, it can affect your speech and ability to use and understand language.  Aphasia is a condition that affects your ability to communicate with others. A stroke often causes aphasia because of damage to the left side of the brain.  Speech-language therapy is a common treatment after a stroke.  Your family and friends can help by being open about  your problems, giving you time to form words and sentences, and speaking in short, simple sentences. This information is not intended to replace advice given to you by your health care provider. Make sure you discuss any questions you have with your health care provider. Document Revised: 04/03/2019 Document Reviewed: 10/26/2016 Elsevier Patient Education  2021 [employer].

## 2020-12-15 NOTE — Discharge Summary (Addendum)
Physician Discharge Summary  Alexander Warner LKG:[id number] DOB: March 19, 1961 DOA: 12/13/2020  PCP: Sigmund Hazel, MD Neurologist: Dr. Gerilyn Pilgrim  Admit date: 12/13/2020 Discharge date: 12/15/2020  Admitted From:  Home  Disposition: Home   Recommendations for Outpatient Follow-up:  1. Follow up with PCP in 1 weeks 2. Follow up with neurologist in 1 month 3. Pt to take plavix with aspirin for 1 month then continue aspirin 81 mg daily for secondary prevention  Discharge Condition: STABLE   CODE STATUS: FULL DIET: Heart Healthy    Brief Hospitalization Summary: Please see all hospital notes, images, labs for full details of the hospitalization. ADMISSION HPI:  Alexander Warner  is a 60 y.o. male, with history of high cholesterol, diabetes mellitus type 2, hypertension, who is still very active, comes in today with a left upper and lower extremity weakness.  Patient reports that really after last he was at work when he began to not feel well.  He felt like his body was heavy.  He then noticed that he was having difficulty with proprioception of his left hand, weakness of his left hand and weakness of his left leg.  He denies any foot drop.  He said he rested for a moment at work and then felt better but not 100%.  He reports that his symptoms were constant.  He describes his leg weakness as his legs feeling like a "removal."  He had no paresthesias.  Does report a family history of stroke.  Patient has a history of high blood pressure and reports he has been taking his blood pressure medications as prescribed.  His blood pressure has been running slightly higher than his normal at 150s over high 90s.  Patient reports he recently had a cold and was taking a over-the-counter decongestant.  He did not take any of his decongestant today.  Patient reports he has never had symptoms like this before.  Patient reports that this time he feels back to his baseline, however on exam he does note over sensation in the left  upper and lower extremity remains.  It is noted that upon his arrival he had left facial droop.  Patient does not smoke, drinks approximately 1 drink once a week, does not use illicit drugs.  He is not vaccinated for COVID.  Patient prefers to be DNR.  In the ED Temp 97.5, heart rate 60-65, respiratory rate 14-20, blood pressure 135/90, satting at 95% No leukocytosis, hemoglobin 15.3 Chemistry panel on the i-STAT reveals a hyperkalemia, but a comprehensive panel by lab shows a potassium of 4.2.   Patient does have hyperglycemia at 227 CT angio head and neck showed no intracranial large or medium vessel occlusion.  Atherosclerotic plaque at both carotid bifurcations but without stenosis.  CT head code stroke without contrast shows normal head CT EKG shows a heart rate of 63, QTc 411, normal sinus rhythm Telemetry neuro recommends admission to hospitalist service for stroke work-up.  Permissive hypertension for 48 hours with a goal of 220/110.  MRI brain.  Echo.  Check A1c and LDL.  Continue 81 mg aspirin.  Start Plavix.  HOSPITAL COURSE   Acute CVA - Fortunately symptoms have resolved.  He remains on aspirin and plavix.  Neuro checks.  Pt reports feeling much better.  He has been started on atorvastatin.  PT/OT eval completed, no follow up recommended.     Dr. Ronal Fear Recommendation: ASSESSMENT/PLAN: 1. 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 Risk factors hypertension, diabetes and age. Dual antiplatelet agents are recommended for 1 month. Subsequently a single agent preferably aspirin 81 mg should suffice. Continue with blood pressure and the blood sugar control.  Echocardiogram 12/14/20 IMPRESSIONS  1. Left ventricular ejection fraction, by estimation, is 60 to 65%. The left ventricle has normal function. The left ventricle has no regional  wall motion abnormalities. Left ventricular diastolic parameters were  normal.  2. Right ventricular systolic function is low normal. The right ventricular size is normal. Tricuspid regurgitation signal is inadequate  for assessing PA pressure.  3. The mitral valve is grossly normal. Mild mitral valve regurgitation.  4. The aortic valve is tricuspid. Aortic valve regurgitation is not visualized.  5. The inferior vena cava is normal in size with greater than 50% respiratory variability, suggesting right atrial pressure of 3 mmHg.  Essential hypertension - resume all home meds.   Hyperlipidemia - atorvastatin 10 mg daily ordered.   Type 2 DM - a1c 6.2%, resume home treatment regimen.    DVT prophylaxis: SCD Code Status: Full  Family Communication: discussed plan of care with patient at bedside Disposition: home  Status is: Observation  The patient remains OBS appropriate and will d/c before 2 midnights.  Dispo: The patient is from: Home  Anticipated d/c is to: Home  Patient currently is medically stable to d/c.              Difficult to place patient No   Consultants:   neurology  Discharge Diagnoses:  Active Problems:   CVA (cerebral vascular accident) (HCC)   Diabetes mellitus type 2 in nonobese [facility])   Hyperlipidemia   Essential hypertension   Discharge Instructions:  Allergies as of 12/15/2020      Reactions   Glimepiride Other (See Comments)   Pt states he didn't like the way it made him feel      Medication List    TAKE these medications   aspirin EC 81 MG tablet Take 1 tablet (81 mg total) by mouth daily. What changed:   when to take this  reasons to take this   atorvastatin 10 MG tablet Commonly known as: LIPITOR Take 10 mg by mouth daily.   clopidogrel 75 MG tablet Commonly known as: PLAVIX Take 1 tablet (75 mg total) by mouth daily.   hydrochlorothiazide 25 MG tablet Commonly known as: HYDRODIURIL Take 1 tablet (25 mg total) by mouth daily.   losartan 100 MG tablet Commonly  known as: COZAAR Take 1 tablet by mouth daily. What changed: Another medication with the same name was removed. Continue taking this medication, and follow the directions you see here.   metFORMIN 500 MG tablet Commonly known as: GLUCOPHAGE Take 500 mg by mouth 2 (two) times daily with a meal.       Follow-up Information    Sigmund Hazel, MD. Schedule an appointment as soon as possible for a visit in 1 week(s).   Specialty: Family Medicine Why: Hospital Follow Up  Contact information: [address] [place] [address] [phone]        Beryle Beams, MD. Schedule an appointment as soon as possible for a visit in 1 month(s).   Specialty: Neurology Why: Stroke Follow Up appointment  Contact information: [address] [place] [address] [phone]              Allergies  Allergen Reactions  . Glimepiride Other (See Comments)    Pt states he didn't like the  way it made him feel   Allergies as of 12/15/2020      Reactions   Glimepiride Other (See Comments)   Pt states he didn't like the way it made him feel      Medication List    TAKE these medications   aspirin EC 81 MG tablet Take 1 tablet (81 mg total) by mouth daily. What changed:   when to take this  reasons to take this   atorvastatin 10 MG tablet Commonly known as: LIPITOR Take 10 mg by mouth daily.   clopidogrel 75 MG tablet Commonly known as: PLAVIX Take 1 tablet (75 mg total) by mouth daily.   hydrochlorothiazide 25 MG tablet Commonly known as: HYDRODIURIL Take 1 tablet (25 mg total) by mouth daily.   losartan 100 MG tablet Commonly known as: COZAAR Take 1 tablet by mouth daily. What changed: Another medication with the same name was removed. Continue taking this medication, and follow the directions you see here.   metFORMIN 500 MG tablet Commonly known as: GLUCOPHAGE Take 500 mg by mouth 2 (two) times daily with a meal.       Procedures/Studies: CT Angio  Head W or Wo Contrast  Result Date: 12/13/2020 CLINICAL DATA:  Left-sided facial droop. Left arm and leg weakness beginning 1200 hours. EXAM: CT ANGIOGRAPHY HEAD AND NECK TECHNIQUE: Multidetector CT imaging of the head and neck was performed using the standard protocol during bolus administration of intravenous contrast. Multiplanar CT image reconstructions and MIPs were obtained to evaluate the vascular anatomy. Carotid stenosis measurements (when applicable) are obtained utilizing NASCET criteria, using the distal internal carotid diameter as the denominator. CONTRAST:  75mL OMNIPAQUE IOHEXOL 350 MG/ML SOLN COMPARISON:  Head CT earlier same day FINDINGS: CTA NECK FINDINGS Aortic arch: Branching pattern of the brachiocephalic vessels from the arch is normal. No origin stenosis. Right carotid system: Common carotid artery widely patent to the bifurcation. Carotid bifurcation shows mild soft plaque but no stenosis or irregularity. Cervical ICA widely patent. Left carotid system: Common carotid artery widely patent to the bifurcation. Soft and calcified plaque at the carotid bifurcation and ICA bulb with some irregularity. No stenosis. Cervical ICA widely patent. Vertebral arteries: Both vertebral artery origins are widely patent. Both vertebral arteries appear normal through the cervical region to the foramen magnum. Skeleton: Mid cervical spondylosis. Other neck: No soft tissue mass or lymphadenopathy. Upper chest: Normal Review of the MIP images confirms the above findings CTA HEAD FINDINGS Anterior circulation: Both internal carotid arteries widely patent through the skull base and siphon regions. The anterior and middle cerebral vessels are patent. No large or medium vessel occlusion, proximal stenosis, aneurysm or vascular malformation. Posterior circulation: Both vertebral arteries widely patent to the basilar. No basilar stenosis. Posterior circulation branch vessels are normal. Venous sinuses: Patent and  normal. Anatomic variants: None significant. Review of the MIP images confirms the above findings IMPRESSION: No intracranial large or medium vessel occlusion. Atherosclerotic plaque at both carotid bifurcations but without stenosis. Mild irregularity on the left. Electronically Signed   By: Paulina Fusi M.D.   On: 12/13/2020 20:32   CT Angio Neck W and/or Wo Contrast  Result Date: 12/13/2020 CLINICAL DATA:  Left-sided facial droop. Left arm and leg weakness beginning 1200 hours. EXAM: CT ANGIOGRAPHY HEAD AND NECK TECHNIQUE: Multidetector CT imaging of the head and neck was performed using the standard protocol during bolus administration of intravenous contrast. Multiplanar CT image reconstructions and MIPs were obtained to evaluate the vascular anatomy.  Carotid stenosis measurements (when applicable) are obtained utilizing NASCET criteria, using the distal internal carotid diameter as the denominator. CONTRAST:  40mL OMNIPAQUE IOHEXOL 350 MG/ML SOLN COMPARISON:  Head CT earlier same day FINDINGS: CTA NECK FINDINGS Aortic arch: Branching pattern of the brachiocephalic vessels from the arch is normal. No origin stenosis. Right carotid system: Common carotid artery widely patent to the bifurcation. Carotid bifurcation shows mild soft plaque but no stenosis or irregularity. Cervical ICA widely patent. Left carotid system: Common carotid artery widely patent to the bifurcation. Soft and calcified plaque at the carotid bifurcation and ICA bulb with some irregularity. No stenosis. Cervical ICA widely patent. Vertebral arteries: Both vertebral artery origins are widely patent. Both vertebral arteries appear normal through the cervical region to the foramen magnum. Skeleton: Mid cervical spondylosis. Other neck: No soft tissue mass or lymphadenopathy. Upper chest: Normal Review of the MIP images confirms the above findings CTA HEAD FINDINGS Anterior circulation: Both internal carotid arteries widely patent through the  skull base and siphon regions. The anterior and middle cerebral vessels are patent. No large or medium vessel occlusion, proximal stenosis, aneurysm or vascular malformation. Posterior circulation: Both vertebral arteries widely patent to the basilar. No basilar stenosis. Posterior circulation branch vessels are normal. Venous sinuses: Patent and normal. Anatomic variants: None significant. Review of the MIP images confirms the above findings IMPRESSION: No intracranial large or medium vessel occlusion. Atherosclerotic plaque at both carotid bifurcations but without stenosis. Mild irregularity on the left. Electronically Signed   By: Paulina Fusi M.D.   On: 12/13/2020 20:32   MR BRAIN WO CONTRAST  Result Date: 12/14/2020 CLINICAL DATA:  Neuro deficit, acute stroke suspected. Left-sided weakness. EXAM: MRI HEAD WITHOUT CONTRAST TECHNIQUE: Multiplanar, multiecho pulse sequences of the brain and surrounding structures were obtained without intravenous contrast. COMPARISON:  None. FINDINGS: Brain: Small acute infarct in the right pons (series 5 and 6, images 11/12). Slight edema without mass effect. No acute hemorrhage. No hydrocephalus, extra-axial fluid collection, mass lesion or mass effect. Mild for age scattered T2/FLAIR hyperintensities, nonspecific but most likely related to chronic microvascular ischemic disease. Bilateral inferior basal ganglia dilated perivascular spaces. Vascular: Major arterial flow voids are maintained at the skull base. Skull and upper cervical spine: Normal marrow signal. Sinuses/Orbits: Mild ethmoid air cell mucosal thickening and small left maxillary sinus retention cyst. Otherwise, clear visualized sinuses. Unremarkable orbits. Other: No mastoid effusions. IMPRESSION: Small acute infarct in the right pons. Slight edema without mass effect. Electronically Signed   By: Feliberto Harts MD   On: 12/14/2020 10:00   ECHOCARDIOGRAM COMPLETE  Result Date: 12/14/2020    ECHOCARDIOGRAM  REPORT   Patient Name:   Alexander Warner Date of Exam: 12/14/2020 Medical Rec #:  [id number]  Height:       70.0 in Accession #:    [id number] Weight:       172.0 lb Date of Birth:  Nov 24, 1960 BSA:          1.958 m Patient Age:    59 years   BP:           130/82 mmHg Patient Gender: M          HR:           64 bpm. [facility] Procedure: 2D Echo Indications:    Stroke I63.9  History:        Patient has no prior history of Echocardiogram examinations.  Stroke; Risk Factors:Hypertension, Diabetes, Dyslipidemia and                 Non-Smoker.  Sonographer:    Jeryl Columbia RDCS (AE) Referring Phys: [id number] ASIA B ZIERLE-GHOSH IMPRESSIONS  1. Left ventricular ejection fraction, by estimation, is 60 to 65%. The left ventricle has normal function. The left ventricle has no regional wall motion abnormalities. Left ventricular diastolic parameters were normal.  2. Right ventricular systolic function is low normal. The right ventricular size is normal. Tricuspid regurgitation signal is inadequate for assessing PA pressure.  3. The mitral valve is grossly normal. Mild mitral valve regurgitation.  4. The aortic valve is tricuspid. Aortic valve regurgitation is not visualized.  5. The inferior vena cava is normal in size with greater than 50% respiratory variability, suggesting right atrial pressure of 3 mmHg. FINDINGS  Left Ventricle: Left ventricular ejection fraction, by estimation, is 60 to 65%. The left ventricle has normal function. The left ventricle has no regional wall motion abnormalities. The left ventricular internal cavity size was normal in size. There is  borderline left ventricular hypertrophy. Left ventricular diastolic parameters were normal. Right Ventricle: The right ventricular size is normal. No increase in right ventricular wall thickness. Right ventricular systolic function is low normal. Tricuspid regurgitation signal is inadequate for assessing PA pressure. Left Atrium: Left  atrial size was normal in size. Right Atrium: Right atrial size was normal in size. Pericardium: There is no evidence of pericardial effusion. Mitral Valve: The mitral valve is grossly normal. Mild mitral valve regurgitation. Tricuspid Valve: The tricuspid valve is grossly normal. Tricuspid valve regurgitation is trivial. Aortic Valve: The aortic valve is tricuspid. There is mild aortic valve annular calcification. Aortic valve regurgitation is not visualized. Pulmonic Valve: The pulmonic valve was grossly normal. Pulmonic valve regurgitation is trivial. Aorta: The aortic root is normal in size and structure. Venous: The inferior vena cava is normal in size with greater than 50% respiratory variability, suggesting right atrial pressure of 3 mmHg. IAS/Shunts: No atrial level shunt detected by color flow Doppler.  LEFT VENTRICLE PLAX 2D LVIDd:         4.72 cm  Diastology LVIDs:         3.02 cm  LV e' medial:    8.92 cm/s LV PW:         1.07 cm  LV E/e' medial:  10.3 LV IVS:        0.97 cm  LV e' lateral:   11.30 cm/s LVOT diam:     1.90 cm  LV E/e' lateral: 8.1 LVOT Area:     2.84 cm  RIGHT VENTRICLE RV S prime:     13.90 cm/s TAPSE (M-mode): 2.7 cm LEFT ATRIUM             Index       RIGHT ATRIUM           Index LA diam:        3.10 cm 1.58 cm/m  RA Area:     13.50 cm LA Vol (A2C):   40.7 ml 20.79 ml/m RA Volume:   35.30 ml  18.03 ml/m LA Vol (A4C):   38.9 ml 19.87 ml/m LA Biplane Vol: 40.9 ml 20.89 ml/m   AORTA Ao Root diam: 3.20 cm MITRAL VALVE MV Area (PHT): 3.46 cm    SHUNTS MV Decel Time: 219 msec    Systemic Diam: 1.90 cm MV E velocity: 91.90 cm/s MV A velocity: 55.80 cm/s MV E/A ratio:  1.65 Nona Dell MD Electronically signed by Nona Dell MD Signature Date/Time: 12/14/2020/12:17:08 PM    Final    CT HEAD CODE STROKE WO CONTRAST  Result Date: 12/13/2020 CLINICAL DATA:  Code stroke. Left-sided weakness. Left facial droop. Left arm and leg weakness. Symptoms began 1200 hours. No speech  disturbance. EXAM: CT HEAD WITHOUT CONTRAST TECHNIQUE: Contiguous axial images were obtained from the base of the skull through the vertex without intravenous contrast. COMPARISON:  None. FINDINGS: Brain: No abnormality seen affecting the brainstem or cerebellum. Dilated perivascular spaces present at the base of the brain on both sides. Cerebral hemispheres otherwise appear normal. No evidence of old or acute large or small vessel infarction. No mass lesion, hemorrhage, hydrocephalus or extra-axial collection. Vascular: No abnormal vascular finding. Skull: Normal Sinuses/Orbits: Clear/normal Other: None ASPECTS (Alberta Stroke Program Early CT Score) - Ganglionic level infarction (caudate, lentiform nuclei, internal capsule, insula, M1-M3 cortex): 7 - Supraganglionic infarction (M4-M6 cortex): 3 Total score (0-10 with 10 being normal): 10 IMPRESSION: 1. Normal head CT. 2. ASPECTS is 10. 3. These results were called by telephone at the time of interpretation on 12/13/2020 at 7:42 pm to provider [facility] , who verbally acknowledged these results. Electronically Signed   By: Paulina Fusi M.D.   On: 12/13/2020 19:48      Subjective: Pt reports feeling much better.   Discharge Exam: Vitals:   12/15/20 0055 12/15/20 0509  BP: (!) 141/92 125/90  Pulse: (!) 59 (!) 57  Resp: 18   Temp: 98.1 F (36.7 C) 98.1 F (36.7 C)  SpO2: 98% 98%   Vitals:   12/14/20 1459 12/14/20 2115 12/15/20 0055 12/15/20 0509  BP: (!) 147/95 (!) 157/95 (!) 141/92 125/90  Pulse: 61 (!) 55 (!) 59 (!) 57  Resp: Temp: 98.4 F (36.9 C) 98 F (36.7 C) 98.1 F (36.7 C) 98.1 F (36.7 C)  TempSrc: Oral Oral Oral Oral  SpO2: 98% 99% 98% 98%  Weight:      Height:       General: Pt is alert, awake, not in acute distress Cardiovascular: normal S1/S2 +, no rubs, no gallops Respiratory: CTA bilaterally, no wheezing, no rhonchi Abdominal: Soft, NT, ND, bowel sounds + Extremities: no edema, no cyanosis Neurology:  nonfocal exam.     The results of significant diagnostics from this hospitalization (including imaging, microbiology, ancillary and laboratory) are listed below for reference.     Microbiology: Recent Results (from the past 240 hour(s))  Resp Panel by RT-PCR (Flu A&B, Covid) Nasopharyngeal Swab     Status: None   Collection Time: 12/13/20  7:16 PM   Specimen: Nasopharyngeal Swab; Nasopharyngeal(NP) swabs in vial transport medium  Result Value Ref Range Status   SARS Coronavirus 2 by RT PCR NEGATIVE NEGATIVE Final    Comment: (NOTE) SARS-CoV-2 target nucleic acids are NOT DETECTED.  The SARS-CoV-2 RNA is generally detectable in upper respiratory specimens during the acute phase of infection. The lowest concentration of SARS-CoV-2 viral copies this assay can detect is 138 copies/mL. A negative result does not preclude SARS-Cov-2 infection and should not be used as the sole basis for treatment or other patient management decisions. A negative result may occur with  improper specimen collection/handling, submission of specimen other than nasopharyngeal swab, presence of viral mutation(s) within the areas targeted by this assay, and inadequate number of viral copies(<138 copies/mL). A negative result must be combined with clinical observations, patient history, and epidemiological information. The expected result  is Negative.  Fact Sheet for Patients:  BloggerCourse.com  Fact Sheet for Healthcare Providers:  SeriousBroker.it  This test is no t yet approved or cleared by the [place] FDA and  has been authorized for detection and/or diagnosis of SARS-CoV-2 by FDA under an Emergency Use Authorization (EUA). This EUA will remain  in effect (meaning this test can be used) for the duration of the COVID-19 declaration under Section 564(b)(1) of the Act, 21 U.S.C.section 360bbb-3(b)(1), unless the authorization is terminated  or  revoked sooner.       Influenza A by PCR NEGATIVE NEGATIVE Final   Influenza B by PCR NEGATIVE NEGATIVE Final    Comment: (NOTE) The Xpert Xpress SARS-CoV-2/FLU/RSV plus assay is intended as an aid in the diagnosis of influenza from Nasopharyngeal swab specimens and should not be used as a sole basis for treatment. Nasal washings and aspirates are unacceptable for Xpert Xpress SARS-CoV-2/FLU/RSV testing.  Fact Sheet for Patients: BloggerCourse.com  Fact Sheet for Healthcare Providers: SeriousBroker.it  This test is not yet approved or cleared by the [place] FDA and has been authorized for detection and/or diagnosis of SARS-CoV-2 by FDA under an Emergency Use Authorization (EUA). This EUA will remain in effect (meaning this test can be used) for the duration of the COVID-19 declaration under Section 564(b)(1) of the Act, 21 U.S.C. section 360bbb-3(b)(1), unless the authorization is terminated or revoked.  Performed at [facility], [address] [place], [place] [address]      Labs: BNP (last 3 results) No results for input(s): BNP in the last 8760 hours. Basic Metabolic Panel: Recent Labs  Lab 12/13/20 1916 12/13/20 1934 12/14/20 0538  NA 135 134*  134* 138  K 4.2 6.8*  6.8* 4.4  CL 100 100  100 102  CO2 28  --  28  GLUCOSE 227* 233*  233* 140*  BUN CREATININE 0.85 0.70  0.70 0.76  CALCIUM 9.7  --  9.6   Liver Function Tests: Recent Labs  Lab 12/13/20 1916 12/14/20 0538  AST 23 21  ALT 26 27  ALKPHOS 49 42  BILITOT 0.7 1.1  PROT 7.4 7.2  ALBUMIN 4.6 4.3   No results for input(s): LIPASE, AMYLASE in the last 168 hours. No results for input(s): AMMONIA in the last 168 hours. CBC: Recent Labs  Lab 12/13/20 1916 12/13/20 1934 12/14/20 0538  WBC 6.0  --  4.9  NEUTROABS 3.6  --   --   HGB 15.3 15.6  15.6 15.2  HCT 43.9 46.0  46.0 44.5  MCV 91.3  --  93.3  PLT 155  --  129*    Cardiac Enzymes: No results for input(s): CKTOTAL, CKMB, CKMBINDEX, TROPONINI in the last 168 hours. BNP: Invalid input(s): POCBNP CBG: Recent Labs  Lab 12/14/20 0719 12/14/20 1058 12/14/20 1611 12/14/20 2116 12/15/20 0727  GLUCAP 148* 120* 120* 149* 139*   D-Dimer No results for input(s): DDIMER in the last 72 hours. Hgb A1c Recent Labs    12/14/20 0538  HGBA1C 6.2*   Lipid Profile Recent Labs    12/14/20 0538  CHOL 162  HDL 74  LDLCALC 64  TRIG 120  CHOLHDL 2.2   Thyroid function studies No results for input(s): TSH, T4TOTAL, T3FREE, THYROIDAB in the last 72 hours.  Invalid input(s): FREET3 Anemia work up No results for input(s): VITAMINB12, FOLATE, FERRITIN, TIBC, IRON, RETICCTPCT in the last 72 hours. Urinalysis    Component Value Date/Time  COLORURINE YELLOW 12/14/2020 0615   APPEARANCEUR CLEAR 12/14/2020 0615   LABSPEC 1.041 (H) 12/14/2020 0615   PHURINE 6.0 12/14/2020 0615   GLUCOSEU NEGATIVE 12/14/2020 0615   HGBUR NEGATIVE 12/14/2020 0615   BILIRUBINUR NEGATIVE 12/14/2020 0615   KETONESUR NEGATIVE 12/14/2020 0615   PROTEINUR NEGATIVE 12/14/2020 0615   NITRITE NEGATIVE 12/14/2020 0615   LEUKOCYTESUR NEGATIVE 12/14/2020 0615   Sepsis Labs Invalid input(s): PROCALCITONIN,  WBC,  LACTICIDVEN Microbiology Recent Results (from the past 240 hour(s))  Resp Panel by RT-PCR (Flu A&B, Covid) Nasopharyngeal Swab     Status: None   Collection Time: 12/13/20  7:16 PM   Specimen: Nasopharyngeal Swab; Nasopharyngeal(NP) swabs in vial transport medium  Result Value Ref Range Status   SARS Coronavirus 2 by RT PCR NEGATIVE NEGATIVE Final    Comment: (NOTE) SARS-CoV-2 target nucleic acids are NOT DETECTED.  The SARS-CoV-2 RNA is generally detectable in upper respiratory specimens during the acute phase of infection. The lowest concentration of SARS-CoV-2 viral copies this assay can detect is 138 copies/mL. A negative result does not preclude  SARS-Cov-2 infection and should not be used as the sole basis for treatment or other patient management decisions. A negative result may occur with  improper specimen collection/handling, submission of specimen other than nasopharyngeal swab, presence of viral mutation(s) within the areas targeted by this assay, and inadequate number of viral copies(<138 copies/mL). A negative result must be combined with clinical observations, patient history, and epidemiological information. The expected result is Negative.  Fact Sheet for Patients:  BloggerCourse.comhttps://www.fda.gov/media/152166/download  Fact Sheet for Healthcare Providers:  SeriousBroker.ithttps://www.fda.gov/media/152162/download  This test is no t yet approved or cleared by the [place] FDA and  has been authorized for detection and/or diagnosis of SARS-CoV-2 by FDA under an Emergency Use Authorization (EUA). This EUA will remain  in effect (meaning this test can be used) for the duration of the COVID-19 declaration under Section 564(b)(1) of the Act, 21 U.S.C.section 360bbb-3(b)(1), unless the authorization is terminated  or revoked sooner.       Influenza A by PCR NEGATIVE NEGATIVE Final   Influenza B by PCR NEGATIVE NEGATIVE Final    Comment: (NOTE) The Xpert Xpress SARS-CoV-2/FLU/RSV plus assay is intended as an aid in the diagnosis of influenza from Nasopharyngeal swab specimens and should not be used as a sole basis for treatment. Nasal washings and aspirates are unacceptable for Xpert Xpress SARS-CoV-2/FLU/RSV testing.  Fact Sheet for Patients: BloggerCourse.comhttps://www.fda.gov/media/152166/download  Fact Sheet for Healthcare Providers: SeriousBroker.ithttps://www.fda.gov/media/152162/download  This test is not yet approved or cleared by the [place] FDA and has been authorized for detection and/or diagnosis of SARS-CoV-2 by FDA under an Emergency Use Authorization (EUA). This EUA will remain in effect (meaning this test can be used) for the duration of  the COVID-19 declaration under Section 564(b)(1) of the Act, 21 U.S.C. section 360bbb-3(b)(1), unless the authorization is terminated or revoked.  Performed at [facility], [address] [place], [place] [address]    Time coordinating discharge:   SIGNED:  Standley Dakinslanford Charm Stenner, MD  [facility] 12/15/2020, 9:27 AM How to contact the [facility] Attending or Consulting provider 7A - 7P or covering provider during after hours 7P -7A, for this patient?  1. Check the care team in [facility] and look for a) attending/consulting TRH provider listed and b) the [facility] team listed 2. Log into [email or web address] and use [place]'s universal password to access. If you do not have the password, please contact the hospital operator. 3. Locate the [facility]  provider you are looking for under [facility] and page to a number that you can be directly reached. 4. If you still have difficulty reaching the provider, please page the [facility] (Director on Call) for the Hospitalists listed on amion for assistance.

## 2020-12-15 NOTE — TOC Transition Note (Signed)
Transition of Care [facility]) - CM/SW Discharge Note   Patient Details  Name: Alexander Warner MRN: [id number] Date of Birth: [birth year]  [facility]) CM/SW Contact:  Leitha Bleak, RN Phone Number: 12/15/2020, 10:08 AM   Clinical Narrative:   Patient admitted with CVA. Medically ready for discharge. Patient is needing speech therapy. No home health agencies will take commercial insurance. TOC spoke with patient's daughter, [name]. She is agreeable to outpatient PT.  Orders placed and added to AVS.     Final next level of care: OP Rehab Barriers to Discharge: Barriers Resolved   Patient Goals and CMS Choice Patient states their goals for this hospitalization and ongoing recovery are:: to go home. CMS Medicare.gov Compare Post Acute Care list provided to:: Patient Represenative (must comment) Choice offered to / list presented to : Adult Children  Discharge Placement              Patient chooses bed at:  [facility]) Patient to be transferred to facility by: family Name of family member notified: [name] Patient and family notified of of transfer: 12/15/20  Discharge Plan and Services    Home

## 2020-12-21 ENCOUNTER — Encounter (HOSPITAL_COMMUNITY): Payer: Self-pay | Admitting: Speech Pathology

## 2020-12-21 ENCOUNTER — Other Ambulatory Visit: Payer: Self-pay

## 2020-12-21 ENCOUNTER — Ambulatory Visit (HOSPITAL_COMMUNITY): Payer: BC Managed Care – PPO | Attending: Family Medicine | Admitting: Speech Pathology

## 2020-12-21 DIAGNOSIS — I10 Essential (primary) hypertension: Secondary | ICD-10-CM | POA: Diagnosis not present

## 2020-12-21 DIAGNOSIS — Z1211 Encounter for screening for malignant neoplasm of colon: Secondary | ICD-10-CM | POA: Diagnosis not present

## 2020-12-21 DIAGNOSIS — E1169 Type 2 diabetes mellitus with other specified complication: Secondary | ICD-10-CM | POA: Diagnosis not present

## 2020-12-21 DIAGNOSIS — E7800 Pure hypercholesterolemia, unspecified: Secondary | ICD-10-CM | POA: Diagnosis not present

## 2020-12-21 DIAGNOSIS — I639 Cerebral infarction, unspecified: Secondary | ICD-10-CM

## 2020-12-21 DIAGNOSIS — Z8673 Personal history of transient ischemic attack (TIA), and cerebral infarction without residual deficits: Secondary | ICD-10-CM | POA: Diagnosis not present

## 2020-12-21 NOTE — Therapy (Signed)
[facility] [address] [place], [place], [address] Phone: [phone]   Fax:  [phone]  Speech Language Pathology Evaluation  Patient Details  Name: Matheau Orona MRN: [id number] Date of Birth: January 15, 1961 No data recorded  Encounter Date: 12/21/2020   End of Session - 12/21/20 1634    Visit Number 1    Number of Visits 1    Authorization Type Deduct 3000- 0 met, OOP 6,000-50.00 met Visit limit or auth not available [facility] portal    SLP Start Time 1519    SLP Stop Time  1549    SLP Time Calculation (min) 30 min    Activity Tolerance Patient tolerated treatment well           Past Medical History:  Diagnosis Date  . Diabetes mellitus without complication ([place])     History reviewed. No pertinent surgical history.  There were no vitals filed for this visit.       SLP Evaluation [facility] - 12/21/20 0001      SLP Visit Information   SLP Received On 12/21/20      General Information   HPI Mr. Hiroshi Krummel is a 60 y/o male with hx of high colestorl, diabetes mellitus type 2, hypertension who was d/c from the hospital on 12/15/20 after admission and treatment of an acute stroke. Pt was evaluated by ST in the hospital revealing mild impairment and a reported slight change from baseline. Pt was referred for an OP ST Cognitive linguistic evaluation.      Balance Screen   Has the patient fallen in the past 6 months No    Has the patient had a decrease in activity level because of a fear of falling?  No    Is the patient reluctant to leave their home because of a fear of falling?  No      Prior Functional Status   Cognitive/Linguistic Baseline Within functional limits    Type of Home Mobile home     Lives With Alone    Available Support Family      Pain Assessment   Pain Assessment No/denies pain      Cognition   Overall Cognitive Status Within Functional Limits for tasks assessed    Memory Appears intact    Awareness Appears  intact    Problem Solving Appears intact      Auditory Comprehension   Overall Auditory Comprehension Appears within functional limits for tasks assessed    Yes/No Questions Within Functional Limits    Commands Within Functional Limits      Visual Recognition/Discrimination   Discrimination Within Function Limits      Reading Comprehension   Reading Status Within funtional limits      Expression   Primary Mode of Expression Verbal      Verbal Expression   Overall Verbal Expression Appears within functional limits for tasks assessed      Written Expression   Dominant Hand Right      Oral Motor/Sensory Function   Overall Oral Motor/Sensory Function Appears within functional limits for tasks assessed      Motor Speech   Overall Motor Speech Appears within functional limits for tasks assessed      Standardized Assessments   Standardized Assessments  Other Assessment   Portions of various screens provided                          SLP Education - 12/21/20  1540    Education Details 12th grade; completed GED    Person(s) Educated Patient    Methods Explanation    Comprehension Verbalized understanding            SLP Short Term Goals - 12/21/20 1641      SLP SHORT TERM GOAL #1   Title Pt will achieve cognitive baseline    Status Achieved            SLP Long Term Goals - 12/21/20 1646      SLP LONG TERM GOAL #1   Title Pt will achieve cognitive baseline    Status Achieved            Plan - 12/21/20 1636    Clinical Impression Statement Speech language and cognitive evaluation completed today; Pt reports upon entering room that he feels his cognition has completely returned to normal further reporting that the "foggy brain" feeling he had in the hospital is better. He reports his only residual symptom is fatigue. Pt was assessed today by means of a combination of various screens and portions of evaluations to gather a full picture of Pt's  cognition. Today, Pt achieved a "normal" score on all portions assessed including memory, attention, language, abstraction, delayed recall, thought organization and problem solving. There is no further ST need noted at this time secondary to cognitive changes resolving. Thank you for this referral,   Consulted and Agree with Plan of Care Patient           Patient will benefit from skilled therapeutic intervention in order to improve the following deficits and impairments:   Acute ischemic stroke [facility])    Problem List Patient Active Problem List   Diagnosis Date Noted  . CVA (cerebral vascular accident) ([place]) 12/13/2020  . Diabetes mellitus type 2 in nonobese ([place]) 12/13/2020  . Hyperlipidemia 12/13/2020  . Essential hypertension 12/13/2020  . Acute ischemic stroke ([place])    Kinleigh Nault H. Roddie Mc, CCC-SLP Speech Language Pathologist  W[facility] Bushy 12/21/2020, 4:47 PM  [facility] [address] [place], [place], [address] Phone: [phone]   Fax:  [phone]  Name: Jermayne Sweeney MRN: [id number] Date of Birth: [birth year]

## 2021-06-05 DIAGNOSIS — E7800 Pure hypercholesterolemia, unspecified: Secondary | ICD-10-CM | POA: Diagnosis not present

## 2021-06-05 DIAGNOSIS — I10 Essential (primary) hypertension: Secondary | ICD-10-CM | POA: Diagnosis not present

## 2021-06-05 DIAGNOSIS — E1169 Type 2 diabetes mellitus with other specified complication: Secondary | ICD-10-CM | POA: Diagnosis not present

## 2021-06-05 DIAGNOSIS — Z8673 Personal history of transient ischemic attack (TIA), and cerebral infarction without residual deficits: Secondary | ICD-10-CM | POA: Diagnosis not present

## 2021-08-01 DIAGNOSIS — Z0000 Encounter for general adult medical examination without abnormal findings: Secondary | ICD-10-CM | POA: Diagnosis not present

## 2021-10-28 ENCOUNTER — Other Ambulatory Visit: Payer: Self-pay

## 2021-10-28 ENCOUNTER — Ambulatory Visit
Admission: EM | Admit: 2021-10-28 | Discharge: 2021-10-28 | Disposition: A | Discharge status: Home / Self Care | Payer: BC Managed Care – PPO | Attending: Family Medicine | Admitting: Family Medicine

## 2021-10-28 ENCOUNTER — Encounter: Payer: Self-pay | Admitting: Emergency Medicine

## 2021-10-28 DIAGNOSIS — J069 Acute upper respiratory infection, unspecified: Secondary | ICD-10-CM | POA: Diagnosis not present

## 2021-10-28 LAB — POCT RAPID STREP A (OFFICE): Rapid Strep A Screen: NEGATIVE

## 2021-10-28 MED ORDER — LIDOCAINE VISCOUS HCL 2 % MT SOLN: 10.0000 mL | OROMUCOSAL | 0 refills | Status: AC | PRN

## 2021-10-28 NOTE — ED Triage Notes (Signed)
Pt reports sore throat, headache, loss of appetite for last several days. Pt reports "I feel like it might by strep."  ?

## 2021-10-28 NOTE — ED Provider Notes (Signed)
?[facility] ? ? ? ?CSN: [id number] ?Arrival date & time: 10/28/21  0805 ? ? ?  ? ?History   ?Chief Complaint ?Chief Complaint  ?Patient presents with  ? Sore Throat  ? ? ?HPI ?Alexander Warner is a 61 y.o. male.  ? ?Presenting today with 2 to 3-day history of sore throat, headache, loss of appetite, body aches, fatigue.  Now also starting to have some runny nose, sinus drainage.  Denies cough, chest pain, shortness of breath, fever, abdominal pain, nausea vomiting or diarrhea.  Not trying anything over-the-counter for symptoms.  No known sick contacts or pertinent chronic medical problems. ? ? ?Past Medical History:  ?Diagnosis Date  ? Diabetes mellitus without complication ([place])   ? ? ?Patient Active Problem List  ? Diagnosis Date Noted  ? CVA (cerebral vascular accident) ([place]) 12/13/2020  ? Diabetes mellitus type 2 in nonobese ([place]) 12/13/2020  ? Hyperlipidemia 12/13/2020  ? Essential hypertension 12/13/2020  ? Acute ischemic stroke [facility])   ? ? ?History reviewed. No pertinent surgical history. ? ? ? ? ?Home Medications   ? ?Prior to Admission medications   ?Medication Sig Start Date End Date Taking? Authorizing Provider  ?lidocaine (XYLOCAINE) 2 % solution Use as directed 10 mLs in the mouth or throat every 3 (three) hours as needed for mouth pain. 10/28/21  Yes Volney American, PA-C  ?aspirin EC 81 MG tablet Take 1 tablet (81 mg total) by mouth daily. 12/15/20   Johnson, Clanford L, MD  ?atorvastatin (LIPITOR) 10 MG tablet Take 10 mg by mouth daily.    [provider]  ?hydrochlorothiazide (HYDRODIURIL) 25 MG tablet Take 1 tablet (25 mg total) by mouth daily. 05/16/14   Dorie Rank, MD  ?losartan (COZAAR) 100 MG tablet Take 1 tablet by mouth daily. 10/12/20   [provider]  ?metFORMIN (GLUCOPHAGE) 500 MG tablet Take 500 mg by mouth 2 (two) times daily with a meal.    [provider]  ? ? ?Family History ?History reviewed. No pertinent family history. ? ?Social History ?Social  History  ? ?Tobacco Use  ? Smoking status: Never  ? Smokeless tobacco: Never  ?Substance Use Topics  ? Alcohol use: Yes  ?  Comment: daily use  ? Drug use: No  ? ? ? ?Allergies   ?Glimepiride ? ? ?Review of Systems ?Review of Systems ?Per HPI ? ?Physical Exam ?Triage Vital Signs ?ED Triage Vitals  ?Enc Vitals Group  ?   BP 10/28/21 0834 (!) 159/90  ?   Pulse Rate 10/28/21 0834 (!) 107  ?   Resp 10/28/21 0834 18  ?   Temp 10/28/21 0834 98.2 ?F (36.8 ?C)  ?   Temp Source 10/28/21 0834 Oral  ?   SpO2 10/28/21 0834 98 %  ?   Weight 10/28/21 0835 175 lb (79.4 kg)  ?   Height 10/28/21 0835 5\' 10"  (1.778 m)  ?   Head Circumference --   ?   Peak Flow --   ?   Pain Score 10/28/21 0834 5  ?   Pain Loc --   ?   Pain Edu? --   ?   Excl. in [place]? --   ? ?No data found. ? ?Updated Vital Signs ?BP (!) 159/90 (BP Location: Right Arm)   Pulse (!) 107   Temp 98.2 ?F (36.8 ?C) (Oral)   Resp 18   Ht 5\' 10"  (1.778 m)   Wt 175 lb (79.4 kg)   SpO2 98%  BMI 25.11 kg/m?  ? ?Visual Acuity ?Right Eye Distance:   ?Left Eye Distance:   ?Bilateral Distance:   ? ?Right Eye Near:   ?Left Eye Near:    ?Bilateral Near:    ? ?Physical Exam ?Vitals and nursing note reviewed.  ?Constitutional:   ?   Appearance: Normal appearance.  ?HENT:  ?   Head: Atraumatic.  ?   Right Ear: Tympanic membrane normal.  ?   Left Ear: Tympanic membrane normal.  ?   Nose: Rhinorrhea present.  ?   Mouth/Throat:  ?   Mouth: Mucous membranes are moist.  ?   Pharynx: Oropharynx is clear. Posterior oropharyngeal erythema present. No oropharyngeal exudate.  ?Eyes:  ?   Extraocular Movements: Extraocular movements intact.  ?   Conjunctiva/sclera: Conjunctivae normal.  ?Cardiovascular:  ?   Rate and Rhythm: Normal rate and regular rhythm.  ?Pulmonary:  ?   Effort: Pulmonary effort is normal.  ?   Breath sounds: Normal breath sounds. No wheezing or rales.  ?Musculoskeletal:     ?   General: Normal range of motion.  ?   Cervical back: Normal range of motion and neck supple.   ?Lymphadenopathy:  ?   Cervical: No cervical adenopathy.  ?Skin: ?   General: Skin is warm and dry.  ?Neurological:  ?   General: No focal deficit present.  ?   Mental Status: He is oriented to person, place, and time.  ?Psychiatric:     ?   Mood and Affect: Mood normal.     ?   Thought Content: Thought content normal.     ?   Judgment: Judgment normal.  ? ? ? ?UC Treatments / Results  ?Labs ?(all labs ordered are listed, but only abnormal results are displayed) ?Labs Reviewed  ?CULTURE, GROUP A STREP [facility])  ?POCT RAPID STREP A (OFFICE)  ? ? ?EKG ? ? ?Radiology ?No results found. ? ?Procedures ?Procedures (including critical care time) ? ?Medications Ordered in UC ?Medications - No data to display ? ?Initial Impression / Assessment and Plan / UC Course  ?I have reviewed the triage vital signs and the nursing notes. ? ?Pertinent labs & imaging results that were available during my care of the patient were reviewed by me and considered in my medical decision making (see chart for details). ? ?  ? ?Mildly hypertensive and tachycardic in triage, otherwise vital signs reassuring.  Rapid strep negative, throat culture pending.  Declines viral testing.  Suspect viral upper respiratory infection.  Treat with viscous lidocaine, supportive over-the-counter medications and home care.  Return for acutely worsening symptoms. ? ?Final Clinical Impressions(s) / UC Diagnoses  ? ?Final diagnoses:  ?Viral URI  ? ?Discharge Instructions   ?None ?  ? ?ED Prescriptions   ? ? Medication Sig Dispense Auth. Provider  ? lidocaine (XYLOCAINE) 2 % solution Use as directed 10 mLs in the mouth or throat every 3 (three) hours as needed for mouth pain. 100 mL Volney American, [place]  ? ?  ? ?PDMP not reviewed this encounter. ?  ?Volney American, PA-C ?10/28/21 0901 ? ?

## 2021-10-31 LAB — CULTURE, GROUP A STREP (THRC)

## 2021-12-13 DIAGNOSIS — Z8673 Personal history of transient ischemic attack (TIA), and cerebral infarction without residual deficits: Secondary | ICD-10-CM | POA: Diagnosis not present

## 2021-12-13 DIAGNOSIS — E1169 Type 2 diabetes mellitus with other specified complication: Secondary | ICD-10-CM | POA: Diagnosis not present

## 2021-12-13 DIAGNOSIS — E7800 Pure hypercholesterolemia, unspecified: Secondary | ICD-10-CM | POA: Diagnosis not present

## 2021-12-13 DIAGNOSIS — I10 Essential (primary) hypertension: Secondary | ICD-10-CM | POA: Diagnosis not present

## 2022-06-11 DIAGNOSIS — E1169 Type 2 diabetes mellitus with other specified complication: Secondary | ICD-10-CM | POA: Diagnosis not present

## 2022-07-09 DIAGNOSIS — I10 Essential (primary) hypertension: Secondary | ICD-10-CM | POA: Diagnosis not present

## 2022-07-13 IMAGING — MR MR HEAD W/O CM
9 of 10 series · 39 of 48 positions shown · non-contrast
Comparison: None.

CLINICAL DATA: Neuro deficit, acute stroke suspected. Left-sided
weakness.

EXAM:
MRI HEAD WITHOUT CONTRAST
TECHNIQUE: Multiplanar, multiecho pulse sequences of the brain and surrounding
structures were obtained without intravenous contrast.

[Series 5: DWI · axial · 4.0mm · 0.88mm/px · z∈[-78,+78]mm · 5 of 40 slices shown (1 of 4)]
[im 1/40]
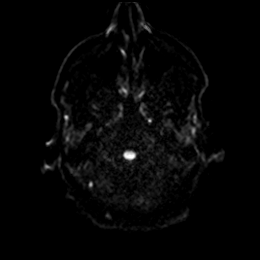
[im 10/40]
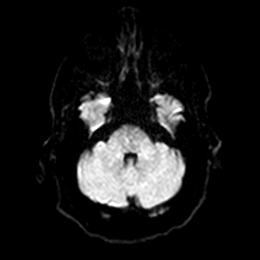
[im 20/40]
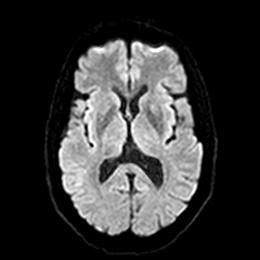
[im 30/40]
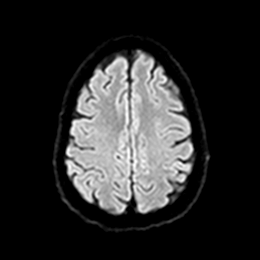
[im 40/40]
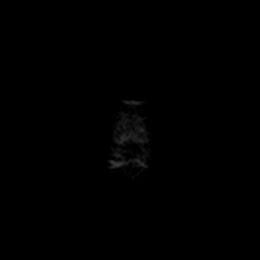

[Series 6: DWI · axial · 4.0mm · 0.88mm/px · z∈[-78,+78]mm · 5 of 37 slices shown (2 of 4)]
[im 1/37]
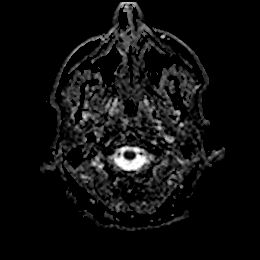
[im 10/37]
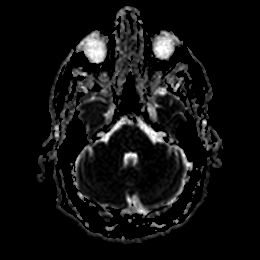
[im 19/37]
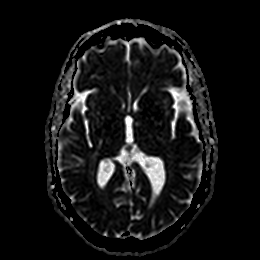
[im 28/37]
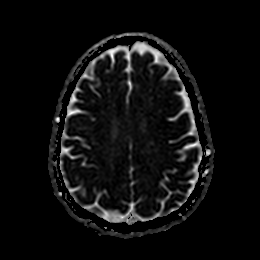
[im 37/37]
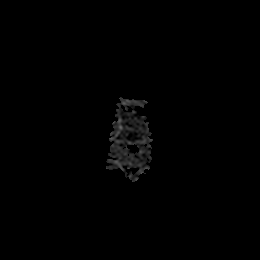

[Series 7: DWI · coronal · 4.0mm · 0.88mm/px · 5 of 32 slices shown (3 of 4)]
[im 1/32]
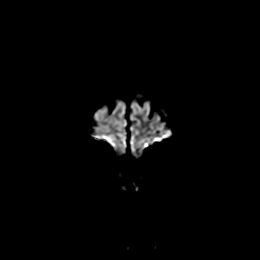
[im 8/32]
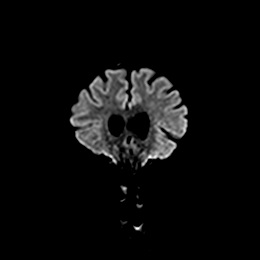
[im 16/32]
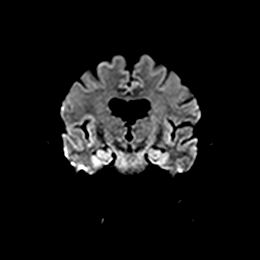
[im 24/32]
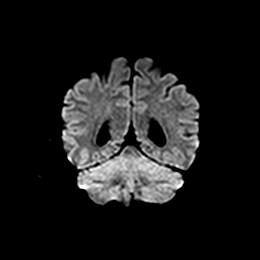
[im 32/32]
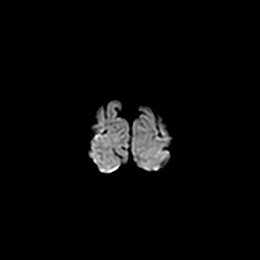

[Series 8: DWI · coronal · 4.0mm · 0.88mm/px · 5 of 32 slices shown (4 of 4)]
[im 1/32]
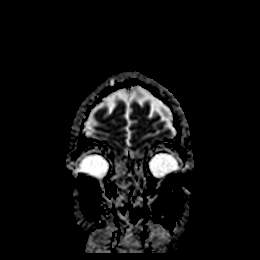
[im 8/32]
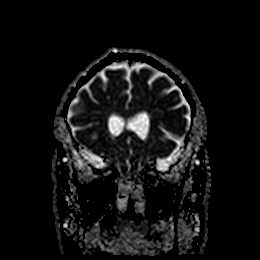
[im 16/32]
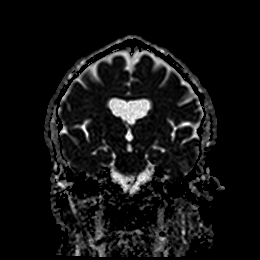
[im 24/32]
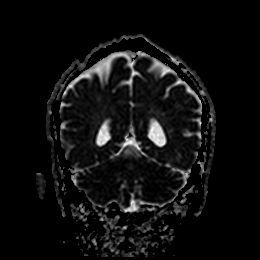
[im 32/32]
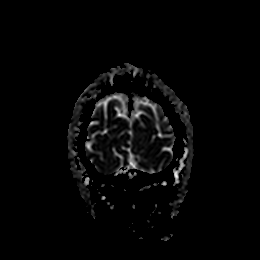

[Series 9: T1 · sagittal · 5.0mm · 0.80mm/px · 3 of 21 slices shown]
[im 1/21]
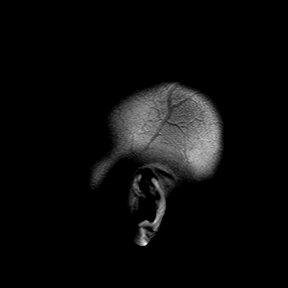
[im 11/21]
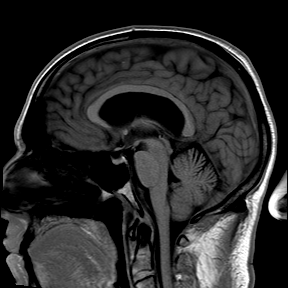
[im 21/21]
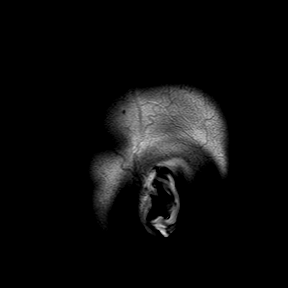

[Series 10: T2 · axial · 5.0mm · 0.72mm/px · z∈[-77,+77]mm · 3 of 23 slices shown (1 of 2)]
[im 1/23]
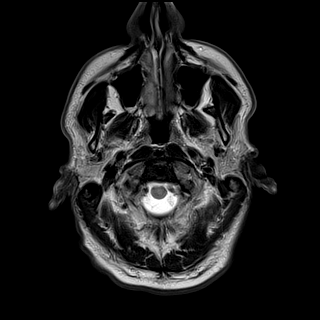
[im 12/23]
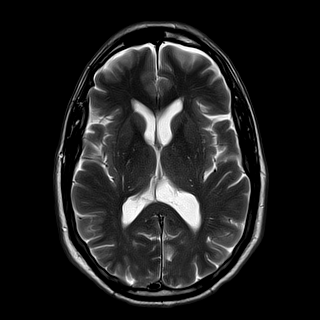
[im 23/23]
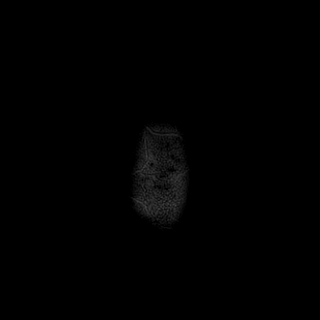

[Series 16: ax hemo · axial · 5.0mm · 0.86mm/px · z∈[-72,+72]mm · 4 of 25 slices shown]
[im 1/25]
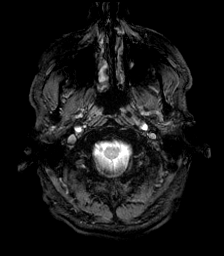
[im 9/25]
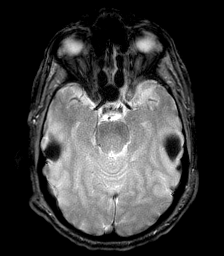
[im 17/25]
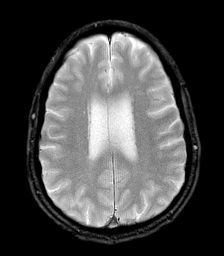
[im 25/25]
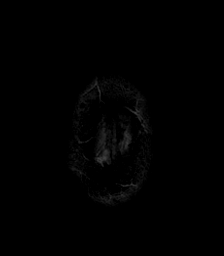

[Series 17: FLAIR · axial · 4.0mm · 0.43mm/px · z∈[-74,+74]mm · 5 of 38 slices shown]
[im 1/38]
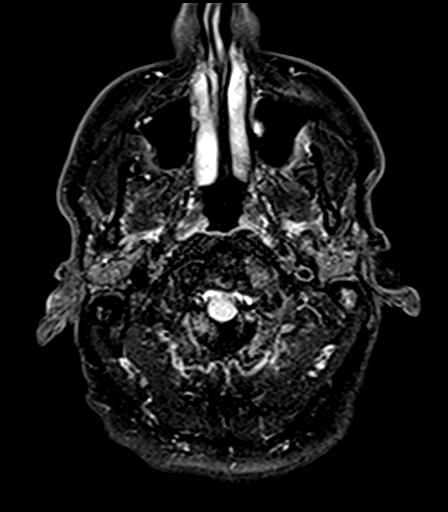
[im 10/38]
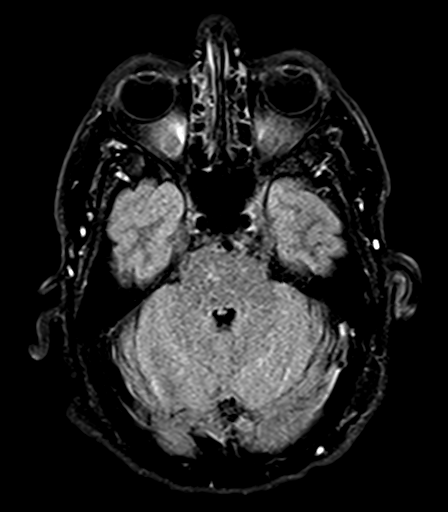
[im 19/38]
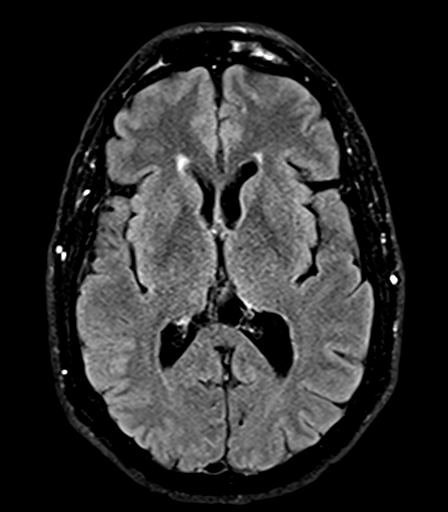
[im 28/38]
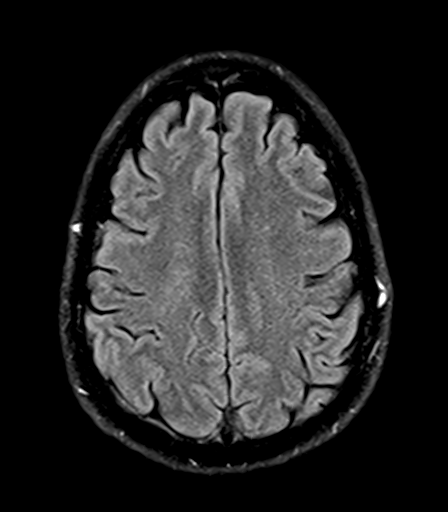
[im 38/38]
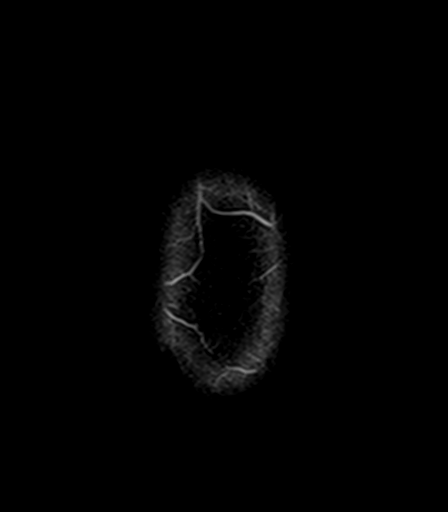

[Series 19: T2 · coronal · 5.0mm · 0.72mm/px · 4 of 28 slices shown (2 of 2)]
[im 1/28]
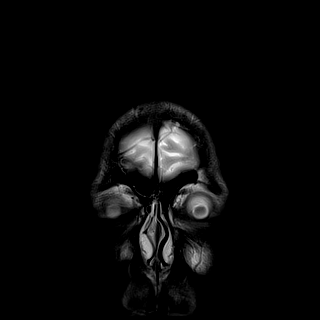
[im 10/28]
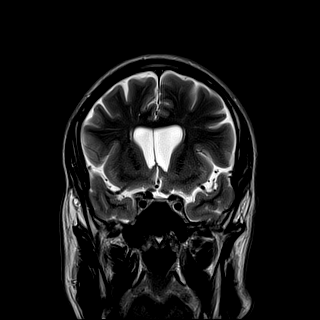
[im 19/28]
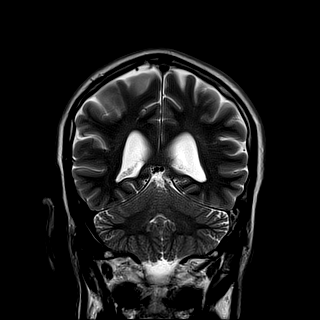
[im 28/28]
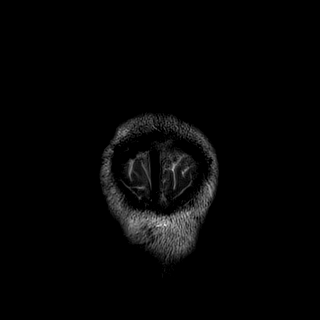

[39 of 48 positions shown; findings below may reference images not displayed]

FINDINGS: Brain: Small acute infarct in the right pons (series 5 and 6, images
[DATE]). Slight edema without mass effect. No acute hemorrhage. No
hydrocephalus, extra-axial fluid collection, mass lesion or mass
effect. Mild for age scattered T2/FLAIR hyperintensities,
nonspecific but most likely related to chronic microvascular
ischemic disease. Bilateral inferior basal ganglia dilated
perivascular spaces.

Vascular: Major arterial flow voids are maintained at the skull
base.

Skull and upper cervical spine: Normal marrow signal.

Sinuses/Orbits: Mild ethmoid air cell mucosal thickening and small
left maxillary sinus retention cyst. Otherwise, clear visualized
sinuses. Unremarkable orbits.

Other: No mastoid effusions.
IMPRESSION: Small acute infarct in the right pons. Slight edema without mass
effect.

## 2023-04-03 ENCOUNTER — Encounter: Payer: Self-pay | Admitting: *Deleted

## 2023-10-02 ENCOUNTER — Encounter: Payer: Self-pay | Admitting: *Deleted
